# Patient Record
Sex: Female | Born: 1990 | Race: White | Hispanic: No | Marital: Married | Smoking: Never smoker
Health system: Southern US, Community
[De-identification: ages and names within clinical notes are randomized; demographics above are authoritative.]

## PROBLEM LIST (undated history)

## (undated) ENCOUNTER — Inpatient Hospital Stay (HOSPITAL_COMMUNITY): Payer: Self-pay

## (undated) DIAGNOSIS — Z789 Other specified health status: Secondary | ICD-10-CM

## (undated) DIAGNOSIS — Z8619 Personal history of other infectious and parasitic diseases: Secondary | ICD-10-CM

## (undated) HISTORY — PX: NO PAST SURGERIES: SHX2092

## (undated) HISTORY — DX: Personal history of other infectious and parasitic diseases: Z86.19

---

## 2011-08-26 ENCOUNTER — Ambulatory Visit (INDEPENDENT_AMBULATORY_CARE_PROVIDER_SITE_OTHER): Payer: 59 | Admitting: Internal Medicine

## 2011-08-26 VITALS — BP 133/80 | HR 68 | Temp 98.1°F | Resp 16 | Ht 65.75 in | Wt 107.0 lb

## 2011-08-26 DIAGNOSIS — R51 Headache: Secondary | ICD-10-CM

## 2011-08-26 DIAGNOSIS — M542 Cervicalgia: Secondary | ICD-10-CM

## 2011-08-26 MED ORDER — BUTALBITAL-APAP-CAFFEINE 50-325-40 MG PO TABS: 1.0000 | ORAL_TABLET | Freq: Two times a day (BID) | ORAL | Status: AC | PRN

## 2011-08-26 MED ORDER — CYCLOBENZAPRINE HCL 10 MG PO TABS: 10.0000 mg | ORAL_TABLET | Freq: Every day | ORAL | Status: AC

## 2011-08-26 NOTE — Progress Notes (Signed)
  Subjective:    Patient ID: Jodi Atkins, female    DOB: [birth year], 21 y.o.   MRN: [id number]  HPIHeadache for the past week Was initially throbbing and all over but is now mostly in the left occiput with tenderness in the left neck as well Denies vision changes, dizziness, nausea No history of illness No prior headache syndromes No illnesses or medications that are new in the last 3 months Neck pain occasionally wakes her at night as she moves    Review of SystemsNo photophobia or visual change/no glasses No chest pain or palpitations No sore throat or fatigue No change in gait No menstrual problems/Is not pregnant    Objective:   Physical Exam Vital signs within normal limits Pupils equal round reactive to light and accommodation/EOMs conjugate TMs and nares clear/oropharynx clear No nodes or thyromegaly Chest clear Heart regular without murmurs Cranial nerves II through XII intact Romberg negative No motor or sensory deficits Deep tendon reflexes symmetrical Neck has a good range of motion but there is tenderness in the left posterior cervical area extending into the left trapezius without spasm      Assessment & Plan:   1. Headache   2. Neck pain    Meds ordered this encounter  Medications  . cyclobenzaprine (FLEXERIL) 10 MG tablet    Sig: Take 1 tablet (10 mg total) by mouth at bedtime.    Dispense:  10 tablet    Refill:  0  . butalbital-acetaminophen-caffeine (FIORICET, ESGIC) 50-325-40 MG per tablet    Sig: Take 1 tablet by mouth 2 (two) times daily as needed for headache.    Dispense:  14 tablet    Refill:  0   Needs close followup If not better in 5-7 days will reexamine and consider whether imaging is necessary

## 2011-08-26 NOTE — Patient Instructions (Addendum)
Recheck in 7-10 days if headache is not completely resolved

## 2011-09-10 ENCOUNTER — Ambulatory Visit (INDEPENDENT_AMBULATORY_CARE_PROVIDER_SITE_OTHER): Payer: 59 | Admitting: Emergency Medicine

## 2011-09-10 VITALS — BP 130/81 | HR 93 | Temp 98.2°F | Resp 16 | Ht 65.5 in | Wt 107.0 lb

## 2011-09-10 DIAGNOSIS — Z3490 Encounter for supervision of normal pregnancy, unspecified, unspecified trimester: Secondary | ICD-10-CM

## 2011-09-10 DIAGNOSIS — Z3201 Encounter for pregnancy test, result positive: Secondary | ICD-10-CM

## 2011-09-10 NOTE — Patient Instructions (Signed)
Pregnancy  If you are planning on getting pregnant, it is a good idea to make a preconception appointment with your care- giver to discuss having a healthy lifestyle before getting pregnant. Such as, diet, weight, exercise, taking prenatal vitamins especially folic acid (it helps prevent brain and spinal cord defects), avoiding alcohol, smoking and illegal drugs, medical problems (diabetes, convulsions), family history of genetic problems, working conditions and immunizations. It is better to have knowledge of these things and do something about them before getting pregnant.  In your pregnancy, it is important to follow certain guidelines to have a healthy baby. It is very important to get good prenatal care and follow your caregiver's instructions. Prenatal care includes all the medical care you receive before your baby's birth. This helps to prevent problems during the pregnancy and childbirth.  HOME CARE INSTRUCTIONS    Start your prenatal visits by the 12th week of pregnancy or before when possible. They are usually scheduled monthly at first. They are more often in the last 2 months before delivery. It is important that you keep your caregiver's appointments and follow your caregiver's instructions regarding medication use, exercise, and diet.   During pregnancy, you are providing food for you and your baby. Eat a regular, well-balanced diet. Choose foods such as meat, fish, milk and other dairy products, vegetables, fruits, whole-grain breads and cereals. Your caregiver will inform you of the ideal weight gain depending on your current height and weight. Drink lots of liquids. Try to drink 8 glasses of water a day.   Alcohol is associated with a number of birth defects including fetal alcohol syndrome. It is best to avoid alcohol completely. Smoking will cause low birth rate and prematurity. Use of alcohol and nicotine during your pregnancy also increases the chances that your child will be chemically  dependent later in their life and may contribute to SIDS (Sudden Infant Death Syndrome).   Do not use illegal drugs.   Only take prescription or over-the-counter medications that are recommended by your caregiver. Other medications can cause genetic and physical problems in the baby.   Morning sickness can often be helped by keeping soda crackers at the bedside. Eat a couple before arising in the morning.   A sexual relationship may be continued until near the end of pregnancy if there are no other problems such as early (premature) leaking of amniotic fluid from the membranes, vaginal bleeding, painful intercourse or belly (abdominal) pain.   Exercise regularly. Check with your caregiver if you are unsure of the safety of some of your exercises.   Do not use hot tubs, steam rooms or saunas. These increase the risk of fainting or passing out and hurting yourself and the baby. Swimming is OK for exercise. Get plenty of rest, including afternoon naps when possible especially in the third trimester.   Avoid toxic odors and chemicals.   Do not wear high heels. They may cause you to lose your balance and fall.   Do not lift over 5 pounds. If you do lift anything, lift with your legs and thighs, not your back.   Avoid long trips, especially in the third trimester.   If you have to travel out of the city or state, take a copy of your medical records with you.  SEEK IMMEDIATE MEDICAL CARE IF:    You develop an unexplained oral temperature above 102 F (38.9 C), or as your caregiver suggests.   You have leaking of fluid from the vagina. If   leaking membranes are suspected, take your temperature and inform your caregiver of this when you call.   There is vaginal spotting or bleeding. Notify your caregiver of the amount and how many pads are used.   You continue to feel sick to your stomach (nauseous) and have no relief from remedies suggested, or you throw up (vomit) blood or coffee ground like  materials.   You develop upper abdominal pain.   You have round ligament discomfort in the lower abdominal area. This still must be evaluated by your caregiver.   You feel contractions of the uterus.   You do not feel the baby move, or there is less movement than before.   You have painful urination.   You have abnormal vaginal discharge.   You have persistent diarrhea.   You get a severe headache.   You have problems with your vision.   You develop muscle weakness.   You feel dizzy and faint.   You develop shortness of breath.   You develop chest pain.   You have back pain that travels down to your leg and feet.   You feel irregular or a very fast heartbeat.   You develop excessive weight gain in a short period of time (5 pounds in 3 to 5 days).   You are involved with a domestic violence situation.  Document Released: 02/27/2005 Document Revised: 02/16/2011 Document Reviewed: 08/21/2008  [employer] Patient Information 2012 [employer].

## 2011-09-10 NOTE — Progress Notes (Signed)
  Subjective:    Patient ID: Jodi Atkins, female    DOB: Jan 30, 1991, 21 y.o.   MRN: [id number]  HPI  patient here requesting a pregnancy test. Her last menstrual period was June 1. They're trying to conceive. She has never been pregnant before.    Review of Systems     Objective:   Physical Exam HEENT exam is unremarkable. Neck supple. Chest is clear to auscultation. Heart regular rate no murmur  Results for orders placed in visit on 09/10/11  POCT URINE PREGNANCY      Component Value Range   Preg Test, Ur Positive          Assessment & Plan:  Check pregnancy test and start prenatal vitamins one a day.

## 2011-11-08 LAB — OB RESULTS CONSOLE ABO/RH: RH Type: POSITIVE

## 2011-11-08 LAB — OB RESULTS CONSOLE HIV ANTIBODY (ROUTINE TESTING): HIV: NONREACTIVE

## 2011-11-08 LAB — OB RESULTS CONSOLE RUBELLA ANTIBODY, IGM: Rubella: IMMUNE

## 2011-11-08 LAB — OB RESULTS CONSOLE HEPATITIS B SURFACE ANTIGEN: Hepatitis B Surface Ag: NEGATIVE

## 2011-11-08 LAB — OB RESULTS CONSOLE GC/CHLAMYDIA: Gonorrhea: NEGATIVE

## 2012-01-31 ENCOUNTER — Ambulatory Visit (INDEPENDENT_AMBULATORY_CARE_PROVIDER_SITE_OTHER): Payer: 59 | Admitting: Family Medicine

## 2012-01-31 VITALS — BP 126/74 | HR 99 | Temp 98.3°F | Resp 17 | Ht 66.0 in | Wt 124.0 lb

## 2012-01-31 DIAGNOSIS — Z331 Pregnant state, incidental: Secondary | ICD-10-CM

## 2012-01-31 DIAGNOSIS — K219 Gastro-esophageal reflux disease without esophagitis: Secondary | ICD-10-CM

## 2012-01-31 DIAGNOSIS — J029 Acute pharyngitis, unspecified: Secondary | ICD-10-CM

## 2012-01-31 NOTE — Progress Notes (Signed)
Subjective:    Patient ID: Jodi Atkins, female    DOB: 03/03/1991, 21 y.o.   MRN: [id number]  HPI  Jodi Atkins is a 21 y.o. female  Sore throat - past  6 days.  Feels tight. Feels slight swollen, but breathing ok.  No rash. Sx's on and off - noted more last night.  No known sick contacts.  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  6 months pregnant.  [facility] - Dr. Joanette Gula.   No meds.     Review of Systems  Constitutional: Negative for fever and chills.  HENT: Positive for sore throat. Negative for congestion, rhinorrhea, sneezing, trouble swallowing, voice change and postnasal drip.   Eyes: Negative for discharge, redness and itching.  Respiratory: Negative for chest tightness, shortness of breath and stridor.   Skin: Negative for color change and rash.      Past Medical History  Diagnosis Date  . Hypertension   . History of chicken pox    History reviewed. No pertinent past surgical history. History   Social History  . Marital Status: Married    Spouse Name: N/A    Number of Children: N/A  . Years of Education: N/A   Occupational History  . Not on file.   Social History Main Topics  . Smoking status: Never Smoker   . Smokeless tobacco: Not on file  . Alcohol Use: No  . Drug Use: No  . Sexually Active: Yes    Birth Control/ Protection: None   Other Topics Concern  . Not on file   Social History Narrative  . No narrative on file       Objective:   Physical Exam  Vitals reviewed. Constitutional: She is oriented to person, place, and time. She appears well-developed and well-nourished. No distress.  HENT:  Head: Normocephalic and atraumatic.  Right Ear: Hearing, tympanic membrane, external ear and ear canal normal.  Left Ear: Hearing, tympanic membrane, external ear and ear canal normal.  Nose: Nose normal.  Mouth/Throat: Oropharynx is clear and moist. No oropharyngeal exudate.  Eyes: Conjunctivae normal and EOM are normal.  Pupils are equal, round, and reactive to light.  Neck: Normal range of motion. Neck supple. No thyromegaly present.  Cardiovascular: Normal rate, regular rhythm, normal heart sounds and intact distal pulses.   No murmur heard. Pulmonary/Chest: Effort normal and breath sounds normal. No respiratory distress. She has no decreased breath sounds. She has no wheezes. She has no rhonchi.  Abdominal:       gravid  Neurological: She is alert and oriented to person, place, and time.  Skin: Skin is warm and dry. No rash noted.  Psychiatric: She has a normal mood and affect. Her behavior is normal.   Results for orders placed in visit on 01/31/12  POCT RAPID STREP A (OFFICE)      Component Value Range   Rapid Strep A Screen Negative  Negative       Assessment & Plan:   Jodi Atkins is a 21 y.o. female 1. Throat soreness  POCT rapid strep A  2. Pregnant state, incidental    3. Reflux      Throat sx's - likely LPR/reflux at stage of pregnancy. No edema of oropharynx, doubt allergic sx's. Persistent, but nonworsening sx's for week.  Trial of zantac otc, benadryl - 25 qhs prn, rtc and ER precautions given.  If not improving in next few days - recheck, or follow up with OBGYN to discuss.  Patient Instructions  Your strep test was negative.  This is likely due to reflux, less likely allergies.  You can try over the counter tums or zantac each day, benadryl over the counter if needed - but this can make you sleepy.  If not improving in next few days - recheck or follow up with your obstetrician. Return to the clinic or go to the nearest emergency room if any of your symptoms worsen or new symptoms occur.    Heartburn During Pregnancy  Heartburn is a burning sensation in the chest caused by stomach acid backing up into the esophagus. Heartburn (also known as "reflux") is common in pregnancy because a certain hormone (progesterone) changes. The progesterone hormone may relax the valve that separates  the esophagus from the stomach. This allows acid to go up into the esophagus, causing heartburn. Heartburn may also happen in pregnancy because the enlarging uterus pushes up on the stomach, which pushes more acid into the esophagus. This is especially true in the later stages of pregnancy. Heartburn problems usually go away after giving birth. CAUSES   The progesterone hormone.  Changing hormone levels.  The growing uterus that pushes stomach acid upward.  Large meals.  Certain foods and drinks.  Exercise.  Increased acid production. SYMPTOMS   Burning pain in the chest or lower throat.  Bitter taste in the mouth.  Coughing. DIAGNOSIS  Heartburn is typically diagnosed by your caregiver when taking a careful history of your concern. Your caregiver may order a blood test to check for a certain type of bacteria that is associated with heartburn. Sometimes, heartburn is diagnosed by prescribing a heartburn medicine to see if the symptoms improve. It is rare in pregnancy to have a procedure called an endoscopy. This is when a tube with a light and a camera on the end is used to examine the esophagus and the stomach. TREATMENT   Your caregiver may tell you to use certain over-the-counter medicines (antacids, acid reducers) for mild heartburn.  Your caregiver may prescribe medicines to decrease stomach acid or to protect your stomach lining.  Your caregiver may recommend certain diet changes.  For severe cases, your caregiver may recommend that the head of the bed be elevated on blocks. (Sleeping with more pillows is not an effective treatment as it only changes the position of your head and does not improve the main problem of stomach acid refluxing into the esophagus.) HOME CARE INSTRUCTIONS   Take all medicines as directed by your caregiver.  Raise the head of your bed by putting blocks under the legs if instructed to by your caregiver.  Do not exercise right after  eating.  Avoid eating 2 or 3 hours before bed. Do not lie down right after eating.  Eat small meals throughout the day instead of 3 large meals.  Identify foods and beverages that make your symptoms worse and avoid them. Foods you may want to avoid include:  Peppers.  Chocolate.  High-fat foods, including fried foods.  Spicy foods.  Garlic and onions.  Citrus fruits, including oranges, grapefruit, lemons, and limes.  Food containing tomatoes or tomato products.  Mint.  Carbonated and caffeinated drinks.  Vinegar. SEEK IMMEDIATE MEDICAL CARE IF:   You have severe chest pain that goes down your arm or into your jaw or neck.  You feel sweaty, dizzy, or lightheaded.  You become short of breath.  You vomit blood.  You have difficulty or pain with swallowing.  You have bloody or  black, tarry stools.  You have episodes of heartburn more than 3 times a week, for more than 2 weeks. MAKE SURE YOU:  Understand these instructions.  Will watch your condition.  Will get help right away if you are not doing well or get worse. Document Released: 02/25/2000 Document Revised: 05/22/2011 Document Reviewed: 08/18/2010 [facility] Patient Information 2013 [place], [place].

## 2012-01-31 NOTE — Patient Instructions (Signed)
Your strep test was negative.  This is likely due to reflux, less likely allergies.  You can try over the counter tums or zantac each day, benadryl over the counter if needed - but this can make you sleepy.  If not improving in next few days - recheck or follow up with your obstetrician. Return to the clinic or go to the nearest emergency room if any of your symptoms worsen or new symptoms occur.    Heartburn During Pregnancy  Heartburn is a burning sensation in the chest caused by stomach acid backing up into the esophagus. Heartburn (also known as "reflux") is common in pregnancy because a certain hormone (progesterone) changes. The progesterone hormone may relax the valve that separates the esophagus from the stomach. This allows acid to go up into the esophagus, causing heartburn. Heartburn may also happen in pregnancy because the enlarging uterus pushes up on the stomach, which pushes more acid into the esophagus. This is especially true in the later stages of pregnancy. Heartburn problems usually go away after giving birth. CAUSES   The progesterone hormone.  Changing hormone levels.  The growing uterus that pushes stomach acid upward.  Large meals.  Certain foods and drinks.  Exercise.  Increased acid production. SYMPTOMS   Burning pain in the chest or lower throat.  Bitter taste in the mouth.  Coughing. DIAGNOSIS  Heartburn is typically diagnosed by your caregiver when taking a careful history of your concern. Your caregiver may order a blood test to check for a certain type of bacteria that is associated with heartburn. Sometimes, heartburn is diagnosed by prescribing a heartburn medicine to see if the symptoms improve. It is rare in pregnancy to have a procedure called an endoscopy. This is when a tube with a light and a camera on the end is used to examine the esophagus and the stomach. TREATMENT   Your caregiver may tell you to use certain over-the-counter medicines  (antacids, acid reducers) for mild heartburn.  Your caregiver may prescribe medicines to decrease stomach acid or to protect your stomach lining.  Your caregiver may recommend certain diet changes.  For severe cases, your caregiver may recommend that the head of the bed be elevated on blocks. (Sleeping with more pillows is not an effective treatment as it only changes the position of your head and does not improve the main problem of stomach acid refluxing into the esophagus.) HOME CARE INSTRUCTIONS   Take all medicines as directed by your caregiver.  Raise the head of your bed by putting blocks under the legs if instructed to by your caregiver.  Do not exercise right after eating.  Avoid eating 2 or 3 hours before bed. Do not lie down right after eating.  Eat small meals throughout the day instead of 3 large meals.  Identify foods and beverages that make your symptoms worse and avoid them. Foods you may want to avoid include:  Peppers.  Chocolate.  High-fat foods, including fried foods.  Spicy foods.  Garlic and onions.  Citrus fruits, including oranges, grapefruit, lemons, and limes.  Food containing tomatoes or tomato products.  Mint.  Carbonated and caffeinated drinks.  Vinegar. SEEK IMMEDIATE MEDICAL CARE IF:   You have severe chest pain that goes down your arm or into your jaw or neck.  You feel sweaty, dizzy, or lightheaded.  You become short of breath.  You vomit blood.  You have difficulty or pain with swallowing.  You have bloody or black, tarry stools.  You have episodes of heartburn more than 3 times a week, for more than 2 weeks. MAKE SURE YOU:  Understand these instructions.  Will watch your condition.  Will get help right away if you are not doing well or get worse. Document Released: 02/25/2000 Document Revised: 05/22/2011 Document Reviewed: 08/18/2010 [facility] Patient Information 2013 [place], [place].

## 2012-03-13 NOTE — L&D Delivery Note (Signed)
Patient was C/C/+2 and pushed for 90 minutes with epidural.   NSVD  female infant, Apgars 9,9, weight P.   The patient had one midline second degree episiotomy and one L labial lacerations; the first was repaired with 2-0 vicryl R; The second was bleeding vigorously and did not stop bleeding until entire laceration was closed with Atkins running lock of 3-0 vicryl R. After closure, there was no expansion of hematoma and the area remained hemostatic . Fundus was firm. EBL was slightly more than expected at 600cc. Placenta was delivered intact. Vagina was clear.  Baby was vigorous to bedside.  Jodi Atkins

## 2012-05-24 ENCOUNTER — Inpatient Hospital Stay (HOSPITAL_COMMUNITY): Admission: AD | Admit: 2012-05-24 | Payer: Self-pay | Source: Ambulatory Visit | Admitting: Obstetrics and Gynecology

## 2012-05-27 ENCOUNTER — Telehealth (HOSPITAL_COMMUNITY): Payer: Self-pay | Admitting: *Deleted

## 2012-05-27 ENCOUNTER — Encounter (HOSPITAL_COMMUNITY): Payer: Self-pay | Admitting: *Deleted

## 2012-05-27 NOTE — Telephone Encounter (Signed)
Preadmission screen  

## 2012-05-31 ENCOUNTER — Encounter (HOSPITAL_COMMUNITY): Payer: Self-pay

## 2012-05-31 ENCOUNTER — Encounter (HOSPITAL_COMMUNITY): Payer: Self-pay | Admitting: Anesthesiology

## 2012-05-31 ENCOUNTER — Inpatient Hospital Stay (HOSPITAL_COMMUNITY)
Admission: RE | Admit: 2012-05-31 | Discharge: 2012-06-02 | DRG: 775 | Disposition: A | Discharge status: Home / Self Care | Payer: Managed Care, Other (non HMO) | Source: Ambulatory Visit | Attending: Obstetrics and Gynecology | Admitting: Obstetrics and Gynecology

## 2012-05-31 ENCOUNTER — Inpatient Hospital Stay (HOSPITAL_COMMUNITY): Payer: Managed Care, Other (non HMO) | Admitting: Anesthesiology

## 2012-05-31 DIAGNOSIS — O480 Post-term pregnancy: Principal | ICD-10-CM | POA: Diagnosis present

## 2012-05-31 DIAGNOSIS — O2650 Maternal hypotension syndrome, unspecified trimester: Secondary | ICD-10-CM | POA: Diagnosis not present

## 2012-05-31 LAB — CBC
HCT: 30.7 % — ABNORMAL LOW (ref 36.0–46.0)
MCV: 81.6 fL (ref 78.0–100.0)
RBC: 3.76 MIL/uL — ABNORMAL LOW (ref 3.87–5.11)
RDW: 14.4 % (ref 11.5–15.5)
WBC: 7.6 10*3/uL (ref 4.0–10.5)

## 2012-05-31 MED ORDER — ACETAMINOPHEN 325 MG PO TABS: 650.0000 mg | ORAL_TABLET | ORAL | Status: DC | PRN

## 2012-05-31 MED ORDER — EPHEDRINE 5 MG/ML INJ
10.0000 mg | INTRAVENOUS | Status: DC | PRN
  Filled 2012-05-31: qty 4

## 2012-05-31 MED ORDER — PHENYLEPHRINE 40 MCG/ML (10ML) SYRINGE FOR IV PUSH (FOR BLOOD PRESSURE SUPPORT)
80.0000 ug | PREFILLED_SYRINGE | INTRAVENOUS | Status: DC | PRN
  Filled 2012-05-31: qty 5

## 2012-05-31 MED ORDER — ZOLPIDEM TARTRATE 5 MG PO TABS: 5.0000 mg | ORAL_TABLET | Freq: Every evening | ORAL | Status: DC | PRN

## 2012-05-31 MED ORDER — DIPHENHYDRAMINE HCL 50 MG/ML IJ SOLN: 12.5000 mg | INTRAMUSCULAR | Status: DC | PRN

## 2012-05-31 MED ORDER — PHENYLEPHRINE 40 MCG/ML (10ML) SYRINGE FOR IV PUSH (FOR BLOOD PRESSURE SUPPORT): 80.0000 ug | PREFILLED_SYRINGE | INTRAVENOUS | Status: DC | PRN

## 2012-05-31 MED ORDER — LACTATED RINGERS IV SOLN: 500.0000 mL | Freq: Once | INTRAVENOUS | Status: DC

## 2012-05-31 MED ORDER — FENTANYL 2.5 MCG/ML BUPIVACAINE 1/10 % EPIDURAL INFUSION (WH - ANES)
14.0000 mL/h | INTRAMUSCULAR | Status: DC | PRN
  Administered 2012-05-31 (×2): 14 mL/h via EPIDURAL
  Filled 2012-05-31 (×2): qty 125

## 2012-05-31 MED ORDER — EPHEDRINE 5 MG/ML INJ: 10.0000 mg | INTRAVENOUS | Status: DC | PRN

## 2012-05-31 MED ORDER — OXYTOCIN BOLUS FROM INFUSION
500.0000 mL | INTRAVENOUS | Status: DC
  Administered 2012-05-31: 500 mL via INTRAVENOUS

## 2012-05-31 MED ORDER — LACTATED RINGERS IV SOLN
INTRAVENOUS | Status: DC
  Administered 2012-05-31 (×6): via INTRAVENOUS

## 2012-05-31 MED ORDER — NALBUPHINE SYRINGE 5 MG/0.5 ML
5.0000 mg | INJECTION | INTRAMUSCULAR | Status: DC | PRN
Start: 2012-05-31 — End: 2012-06-01

## 2012-05-31 MED ORDER — TERBUTALINE SULFATE 1 MG/ML IJ SOLN: 0.2500 mg | Freq: Once | INTRAMUSCULAR | Status: AC | PRN

## 2012-05-31 MED ORDER — OXYCODONE-ACETAMINOPHEN 5-325 MG PO TABS: 1.0000 | ORAL_TABLET | ORAL | Status: DC | PRN

## 2012-05-31 MED ORDER — IBUPROFEN 600 MG PO TABS: 600.0000 mg | ORAL_TABLET | Freq: Four times a day (QID) | ORAL | Status: DC | PRN

## 2012-05-31 MED ORDER — FLEET ENEMA 7-19 GM/118ML RE ENEM: 1.0000 | ENEMA | RECTAL | Status: DC | PRN

## 2012-05-31 MED ORDER — OXYTOCIN 40 UNITS IN LACTATED RINGERS INFUSION - SIMPLE MED
1.0000 m[IU]/min | INTRAVENOUS | Status: DC
  Administered 2012-05-31: 2 m[IU]/min via INTRAVENOUS
  Filled 2012-05-31: qty 1000

## 2012-05-31 MED ORDER — LACTATED RINGERS IV SOLN
500.0000 mL | INTRAVENOUS | Status: DC | PRN
  Administered 2012-05-31: 1000 mL via INTRAVENOUS

## 2012-05-31 MED ORDER — OXYTOCIN 40 UNITS IN LACTATED RINGERS INFUSION - SIMPLE MED: 62.5000 mL/h | INTRAVENOUS | Status: DC

## 2012-05-31 MED ORDER — ONDANSETRON HCL 4 MG/2ML IJ SOLN: 4.0000 mg | Freq: Four times a day (QID) | INTRAMUSCULAR | Status: DC | PRN

## 2012-05-31 MED ORDER — LIDOCAINE HCL (PF) 1 % IJ SOLN
30.0000 mL | INTRAMUSCULAR | Status: DC | PRN
  Filled 2012-05-31: qty 30

## 2012-05-31 MED ORDER — CITRIC ACID-SODIUM CITRATE 334-500 MG/5ML PO SOLN: 30.0000 mL | ORAL | Status: DC | PRN

## 2012-05-31 MED ORDER — SODIUM BICARBONATE 8.4 % IV SOLN
INTRAVENOUS | Status: DC | PRN
  Administered 2012-05-31: 5 mL via EPIDURAL

## 2012-05-31 NOTE — Anesthesia Procedure Notes (Signed)

## 2012-05-31 NOTE — H&P (Signed)
22 y.o. [redacted]w[redacted]d  G1P0 comes in for postdates induction.  Otherwise has good fetal movement and no bleeding.  Past Medical History  Diagnosis Date  . Hypertension   . History of chicken pox    History reviewed. No pertinent past surgical history.  OB History   Grav Para Term Preterm Abortions TAB SAB Ect Mult Living   1              # Outc Date GA Lbr Len/2nd Wgt Sex Del Anes PTL Lv   1 CUR               History   Social History  . Marital Status: Married    Spouse Name: N/A    Number of Children: N/A  . Years of Education: N/A   Occupational History  . Not on file.   Social History Main Topics  . Smoking status: Never Smoker   . Smokeless tobacco: Not on file  . Alcohol Use: No  . Drug Use: No  . Sexually Active: Yes    Birth Control/ Protection: None   Other Topics Concern  . Not on file   Social History Narrative  . No narrative on file   Aleve and Ibuprofen    Prenatal Transfer Tool  Maternal Diabetes: No Genetic Screening: Declined Maternal Ultrasounds/Referrals: Normal Fetal Ultrasounds or other Referrals:  None Maternal Substance Abuse:  No Significant Maternal Medications:  None Significant Maternal Lab Results: None  Other JYN:WGNF    Filed Vitals:   05/31/12 0729  BP: 139/89  Pulse: 87  Temp: 98.1 F (36.7 C)  Resp: 20     Lungs/Cor:  NAD Abdomen:  soft, gravid Ex:  no cords, erythema SVE:  3/70/-2, clear fluid with AROM FHTs:  110, good STV, NST R Toco:  q4-10   A/P   Term postdates induction.  GBS Neg.  Janeice Stegall A

## 2012-05-31 NOTE — Anesthesia Preprocedure Evaluation (Signed)

## 2012-06-01 LAB — CBC
HCT: 21.4 % — ABNORMAL LOW (ref 36.0–46.0)
Hemoglobin: 7.1 g/dL — ABNORMAL LOW (ref 12.0–15.0)
MCH: 26.7 pg (ref 26.0–34.0)
MCV: 80.5 fL (ref 78.0–100.0)
RBC: 2.66 MIL/uL — ABNORMAL LOW (ref 3.87–5.11)
WBC: 15.2 10*3/uL — ABNORMAL HIGH (ref 4.0–10.5)

## 2012-06-01 LAB — TYPE AND SCREEN

## 2012-06-01 MED ORDER — BENZOCAINE-MENTHOL 20-0.5 % EX AERO
1.0000 "application " | INHALATION_SPRAY | CUTANEOUS | Status: DC | PRN
  Filled 2012-06-01: qty 56

## 2012-06-01 MED ORDER — MAGNESIUM HYDROXIDE 400 MG/5ML PO SUSP: 30.0000 mL | ORAL | Status: DC | PRN

## 2012-06-01 MED ORDER — ZOLPIDEM TARTRATE 5 MG PO TABS: 5.0000 mg | ORAL_TABLET | Freq: Every evening | ORAL | Status: DC | PRN

## 2012-06-01 MED ORDER — SODIUM CHLORIDE 0.9 % IV SOLN: 250.0000 mL | INTRAVENOUS | Status: DC | PRN

## 2012-06-01 MED ORDER — METHYLERGONOVINE MALEATE 0.2 MG PO TABS: 0.2000 mg | ORAL_TABLET | ORAL | Status: DC | PRN

## 2012-06-01 MED ORDER — ONDANSETRON HCL 4 MG PO TABS: 4.0000 mg | ORAL_TABLET | ORAL | Status: DC | PRN

## 2012-06-01 MED ORDER — SODIUM CHLORIDE 0.9 % IJ SOLN
3.0000 mL | Freq: Two times a day (BID) | INTRAMUSCULAR | Status: DC
  Administered 2012-06-01: 3 mL via INTRAVENOUS

## 2012-06-01 MED ORDER — LANOLIN HYDROUS EX OINT: TOPICAL_OINTMENT | CUTANEOUS | Status: DC | PRN

## 2012-06-01 MED ORDER — SIMETHICONE 80 MG PO CHEW
80.0000 mg | CHEWABLE_TABLET | ORAL | Status: DC | PRN
  Administered 2012-06-01: 80 mg via ORAL

## 2012-06-01 MED ORDER — TETANUS-DIPHTH-ACELL PERTUSSIS 5-2.5-18.5 LF-MCG/0.5 IM SUSP: 0.5000 mL | Freq: Once | INTRAMUSCULAR | Status: DC

## 2012-06-01 MED ORDER — ONDANSETRON HCL 4 MG/2ML IJ SOLN: 4.0000 mg | INTRAMUSCULAR | Status: DC | PRN

## 2012-06-01 MED ORDER — OXYCODONE-ACETAMINOPHEN 5-325 MG PO TABS
1.0000 | ORAL_TABLET | ORAL | Status: DC | PRN
  Administered 2012-06-01 (×2): 1 via ORAL
  Filled 2012-06-01 (×2): qty 1

## 2012-06-01 MED ORDER — PRENATAL MULTIVITAMIN CH
1.0000 | ORAL_TABLET | Freq: Every day | ORAL | Status: DC
  Administered 2012-06-01 – 2012-06-02 (×2): 1 via ORAL
  Filled 2012-06-01 (×2): qty 1

## 2012-06-01 MED ORDER — DIPHENHYDRAMINE HCL 25 MG PO CAPS: 25.0000 mg | ORAL_CAPSULE | Freq: Four times a day (QID) | ORAL | Status: DC | PRN

## 2012-06-01 MED ORDER — MEASLES, MUMPS & RUBELLA VAC ~~LOC~~ INJ
0.5000 mL | INJECTION | Freq: Once | SUBCUTANEOUS | Status: DC
  Filled 2012-06-01: qty 0.5

## 2012-06-01 MED ORDER — SENNOSIDES-DOCUSATE SODIUM 8.6-50 MG PO TABS
2.0000 | ORAL_TABLET | Freq: Every day | ORAL | Status: DC
  Administered 2012-06-01: 2 via ORAL

## 2012-06-01 MED ORDER — SODIUM CHLORIDE 0.9 % IJ SOLN: 3.0000 mL | INTRAMUSCULAR | Status: DC | PRN

## 2012-06-01 MED ORDER — METHYLERGONOVINE MALEATE 0.2 MG/ML IJ SOLN: 0.2000 mg | INTRAMUSCULAR | Status: DC | PRN

## 2012-06-01 MED ORDER — FERROUS SULFATE 325 (65 FE) MG PO TABS
325.0000 mg | ORAL_TABLET | Freq: Two times a day (BID) | ORAL | Status: DC
  Administered 2012-06-01 – 2012-06-02 (×3): 325 mg via ORAL
  Filled 2012-06-01 (×3): qty 1

## 2012-06-01 MED ORDER — WITCH HAZEL-GLYCERIN EX PADS: 1.0000 "application " | MEDICATED_PAD | CUTANEOUS | Status: DC | PRN

## 2012-06-01 MED ORDER — ACETAMINOPHEN 325 MG PO TABS: 650.0000 mg | ORAL_TABLET | Freq: Four times a day (QID) | ORAL | Status: DC | PRN

## 2012-06-01 MED ORDER — DIBUCAINE 1 % RE OINT: 1.0000 "application " | TOPICAL_OINTMENT | RECTAL | Status: DC | PRN

## 2012-06-01 NOTE — Discharge Summary (Signed)
Obstetric Discharge Summary Reason for Admission: induction of labor Prenatal Procedures: none Intrapartum Procedures: spontaneous vaginal delivery Postpartum Procedures: none Complications-Operative and Postpartum: 2 degree perineal laceration and labial laceration; pp anemia  CBC    Component Value Date/Time   WBC 15.2* 06/01/2012 0535   RBC 2.66* 06/01/2012 0535   HGB 7.1* 06/01/2012 0535   HCT 21.4* 06/01/2012 0535   PLT 182 06/01/2012 0535   MCV 80.5 06/01/2012 0535   MCH 26.7 06/01/2012 0535   MCHC 33.2 06/01/2012 0535   RDW 14.4 06/01/2012 0535      Discharge Diagnoses: Term Pregnancy-delivered  Discharge Information: Date: 06/01/2012 Activity: pelvic rest Diet: routine Medications: Ibuprofen and Iron Condition: stable Instructions: refer to practice specific booklet Discharge to: home Follow-up Information   Follow up with Jodi Stgermain A, MD. Schedule an appointment as soon as possible for Atkins visit in 4 weeks.   Contact information:   [address] Jodi Atkins [place] [address] [phone]       Newborn Data: Live born female  Birth Weight: 7 lb 12 oz (3515 g) APGAR: 9, 9  Home with mother.  Jodi Atkins 06/01/2012, 4:36 AM

## 2012-06-01 NOTE — Progress Notes (Signed)
Patient is eating, ambulating, voiding.  Pain control is good. Pt had one episode of dizziness but was safely transferred back bed.     Filed Vitals:   06/01/12 0000 06/01/12 0010 06/01/12 0033 06/01/12 0100  BP: 114/57 112/60 114/61 116/74  Pulse: 78 75 86 90  Temp:    99.2 F (37.3 C)  TempSrc:    Oral  Resp: 16 14 14 18   Height:      Weight:      SpO2:    98%    Fundus firm Perineum without swelling.  Lab Results  Component Value Date   WBC 7.6 05/31/2012   HGB 10.0* 05/31/2012   HCT 30.7* 05/31/2012   MCV 81.6 05/31/2012   PLT 233 05/31/2012    --/--/O POS (03/21 0755)/RI  A/P Post partum day 1; dizziness after blood loss about 600.  Routine care with anemia precautions and iron.  CBC P.  Expect d/c tomorrow.    Monserrat Vidaurri A

## 2012-06-01 NOTE — Anesthesia Postprocedure Evaluation (Signed)
Anesthesia Post Note  Patient: @Jodi Atkins @[facility]   Procedure(s) Performed: CLE/C/S  Anesthesia type: Epidural  Patient location: Mother/Baby  Post pain: Pain level controlled  Post assessment: Post-op Vital signs reviewed  Last Vitals: BP 130/66  Pulse 84  Temp(Src) 37 C (Oral)  Resp 18  Ht 5\' 5"  (1.651 m)  Wt 146 lb (66.225 kg)  BMI 24.3 kg/m2  SpO2 98%  LMP 08/18/2011  Post vital signs: Reviewed  Level of consciousness: awake  Complications: No apparent anesthesia complications

## 2012-06-01 NOTE — Progress Notes (Signed)
Pt up to the bathroom using stedy. Pt seated on the toilet with stedy in place and had a syncopal episode. Pt responded to ammonia, IV fluid bolus, and cold rag placed on the back of her neck. Pt moved back to bed to be assessed.

## 2012-06-02 NOTE — Progress Notes (Signed)
Patient is eating, ambulating, voiding.  Pain control is good.  Vital signs stable and no dizziness.  Filed Vitals:   06/01/12 0600 06/01/12 1223 06/01/12 1809 06/02/12 0510  BP: 130/66 106/65 96/60 100/54  Pulse: 84 88 98 86  Temp: 98.6 F (37 C) 98.1 F (36.7 C) 98.7 F (37.1 C) 97.8 F (36.6 C)  TempSrc: Oral Axillary Oral Oral  Resp: 18 16 18 20   Height:      Weight:      SpO2: 98%       Fundus firm Perineum without swelling.  Lab Results  Component Value Date   WBC 15.2* 06/01/2012   HGB 7.1* 06/01/2012   HCT 21.4* 06/01/2012   MCV 80.5 06/01/2012   PLT 182 06/01/2012    --/--/O POS (03/22 0535)/RI  A/P Post partum day 2.  Routine care.  Expect d/c today.    Deno Sida A

## 2012-07-11 ENCOUNTER — Ambulatory Visit: Payer: Managed Care, Other (non HMO)

## 2012-07-11 ENCOUNTER — Ambulatory Visit (INDEPENDENT_AMBULATORY_CARE_PROVIDER_SITE_OTHER): Payer: Managed Care, Other (non HMO) | Admitting: Emergency Medicine

## 2012-07-11 VITALS — BP 112/68 | HR 68 | Temp 98.3°F | Resp 16 | Ht 66.0 in | Wt 116.4 lb

## 2012-07-11 DIAGNOSIS — R079 Chest pain, unspecified: Secondary | ICD-10-CM

## 2012-07-11 MED ORDER — TRAMADOL HCL 50 MG PO TABS: 50.0000 mg | ORAL_TABLET | Freq: Four times a day (QID) | ORAL | Status: DC | PRN

## 2012-07-11 NOTE — Progress Notes (Signed)
[facility] [address] [place] [address] [phone]  Date:  07/11/2012   Name:  Jodi Atkins   DOB:  02-May-1990   MRN:  [id number]  PCP:  Tally Due, MD    Chief Complaint: Chest Pain   History of Present Illness:  Jodi Atkins is a 22 y.o. very pleasant female patient who presents with the following:  Has a two week history of left anterior chest wall pain. Located in the left anterior infra mammary chest wall.  No antecedent illness or injury.  No overuse.  No cough or coryza, shortness of breath or wheezing.  No nausea or vomiting.  No stool change or GERD symptoms.  6 weeks post partum uncomplicated pregnancy.  No improvement with over the counter medications or other home remedies. Denies other complaint or health concern today.   There are no active problems to display for this patient.   Past Medical History  Diagnosis Date  . Hypertension   . History of chicken pox     No past surgical history on file.  History  Substance Use Topics  . Smoking status: Never Smoker   . Smokeless tobacco: Not on file  . Alcohol Use: No    Family History  Problem Relation Age of Onset  . Cancer Mother     lymphoma  . Cancer Maternal Aunt   . Cancer Maternal Grandmother     lymphoma  . Cancer Paternal Grandmother     Allergies  Allergen Reactions  . Aleve (Naproxen) Anaphylaxis  . Ibuprofen Hives    Medication list has been reviewed and updated.  Current Outpatient Prescriptions on File Prior to Visit  Medication Sig Dispense Refill  . Prenatal Vit-Fe Fumarate-FA (PRENATAL MULTIVITAMIN) TABS Take 1 tablet by mouth daily at 12 noon.       No current facility-administered medications on file prior to visit.    Review of Systems:  As per HPI, otherwise negative.    Physical Examination: Filed Vitals:   07/11/12 1751  BP: 112/68  Pulse: 68  Temp: 98.3 F (36.8 C)  Resp: 16   Filed Vitals:   07/11/12 1751  Height: 5\' 6"   (1.676 m)  Weight: 116 lb 6.4 oz (52.799 kg)   Body mass index is 18.8 kg/(m^2). Ideal Body Weight: Weight in (lb) to have BMI = 25: 154.6  GEN: WDWN, NAD, Non-toxic, A & O x 3 HEENT: Atraumatic, Normocephalic. Neck supple. No masses, No LAD. Ears and Nose: No external deformity. CV: RRR, No M/G/R. No JVD. No thrill. No extra heart sounds. PULM: CTA B, no wheezes, crackles, rhonchi. No retractions. No resp. distress. No accessory muscle use. Left anterior chest wall tenderness   ABD: S, NT, ND, +BS. No rebound. No HSM. EXTR: No c/c/e NEURO Normal gait.  PSYCH: Normally interactive. Conversant. Not depressed or anxious appearing.  Calm demeanor.    Assessment and Plan: Chest wall pain Signed,  Phillips Odor, MD  [facility] reading (PRIMARY) by  Dr. Dareen Piano.   Negative chest.

## 2012-07-11 NOTE — Patient Instructions (Addendum)
Chest Wall Pain Chest wall pain is pain in or around the bones and muscles of your chest. It may take up to 6 weeks to get better. It may take longer if you must stay physically active in your work and activities.  CAUSES  Chest wall pain may happen on its own. However, it may be caused by:  A viral illness like the flu.  Injury.  Coughing.  Exercise.  Arthritis.  Fibromyalgia.  Shingles. HOME CARE INSTRUCTIONS   Avoid overtiring physical activity. Try not to strain or perform activities that cause pain. This includes any activities using your chest or your abdominal and side muscles, especially if heavy weights are used.  Put ice on the sore area.  Put ice in a plastic bag.  Place a towel between your skin and the bag.  Leave the ice on for 15 to 20 minutes per hour while awake for the first 2 days.  Only take over-the-counter or prescription medicines for pain, discomfort, or fever as directed by your caregiver. SEEK IMMEDIATE MEDICAL CARE IF:   Your pain increases, or you are very uncomfortable.  You have a fever.  Your chest pain becomes worse.  You have new, unexplained symptoms.  You have nausea or vomiting.  You feel sweaty or lightheaded.  You have a cough with phlegm (sputum), or you cough up blood. MAKE SURE YOU:   Understand these instructions.  Will watch your condition.  Will get help right away if you are not doing well or get worse. Document Released: 02/27/2005 Document Revised: 05/22/2011 Document Reviewed: 10/24/2010 Exit[employer] Patient Information 2013 [employer].  

## 2012-07-17 ENCOUNTER — Telehealth: Payer: Self-pay

## 2012-07-17 NOTE — Telephone Encounter (Signed)
What are her symptoms? She states she is not any better at all, she has not developed a rash at area. Has rib pain, please advise, what is next for her ? rtc

## 2012-07-17 NOTE — Telephone Encounter (Signed)
PT STILL NOT FEELING WELL. [phone]

## 2012-07-18 NOTE — Telephone Encounter (Signed)
RTC for further eval

## 2012-07-19 NOTE — Telephone Encounter (Signed)
Thanks, I have called her to advise.  

## 2012-07-24 ENCOUNTER — Ambulatory Visit (INDEPENDENT_AMBULATORY_CARE_PROVIDER_SITE_OTHER): Payer: Managed Care, Other (non HMO) | Admitting: Emergency Medicine

## 2012-07-24 VITALS — BP 99/61 | HR 71 | Temp 98.5°F | Resp 17 | Ht 66.0 in | Wt 115.0 lb

## 2012-07-24 DIAGNOSIS — R1012 Left upper quadrant pain: Secondary | ICD-10-CM

## 2012-07-24 LAB — POCT CBC
Lymph, poc: 2.3 (ref 0.6–3.4)
MCH, POC: 22.6 pg — AB (ref 27–31.2)
MCHC: 29.3 g/dL — AB (ref 31.8–35.4)
MCV: 77.3 fL — AB (ref 80–97)
MID (cbc): 0.4 (ref 0–0.9)
MPV: 8.3 fL (ref 0–99.8)
POC LYMPH PERCENT: 49.2 %L (ref 10–50)
POC MID %: 9.1 %M (ref 0–12)
Platelet Count, POC: 344 10*3/uL (ref 142–424)
WBC: 4.7 10*3/uL (ref 4.6–10.2)

## 2012-07-24 LAB — POCT UA - MICROSCOPIC ONLY
Bacteria, U Microscopic: NEGATIVE
Casts, Ur, LPF, POC: NEGATIVE
Crystals, Ur, HPF, POC: NEGATIVE
Yeast, UA: NEGATIVE

## 2012-07-24 LAB — POCT URINALYSIS DIPSTICK
Glucose, UA: NEGATIVE
Nitrite, UA: NEGATIVE
Spec Grav, UA: 1.02
Urobilinogen, UA: 0.2

## 2012-07-24 MED ORDER — POLYETHYLENE GLYCOL 3350 17 GM/SCOOP PO POWD: 17.0000 g | Freq: Every day | ORAL | Status: DC

## 2012-07-24 NOTE — Progress Notes (Signed)
[facility] [address] [place] [address] [phone]  Date:  07/24/2012   Name:  Jodi Atkins   DOB:  07-27-1990   MRN:  [id number]  PCP:  Tally Due, MD    Chief Complaint: Flank Pain   History of Present Illness:  Jodi Atkins is a 22 y.o. very pleasant female patient who presents with the following:  7 weeks post partum.  Has six week pain in left upper abdomen and left side of chest "inside her chest" below the breast.  Says the pain radiates around the left side to her back and is a burning pain.  Pain is not particularly severe and is intermittent.  Worse when lays on left side  No factors lessen pain She has no fever, chills, cough, nausea or vomiting.  No stool change, cough, coryza, bruising, shortness of breath.  No decreased exercise tolerance.  No weight loss, food intolerance, dysuria, urgency or frequency.  No blood in stool or vomiting blood, no blood in urine.  No heartburn.  No improvement with over the counter medications or other home remedies. Denies other complaint or health concern today.  Seen 2 weeks ago with same pain and had a normal CXR.  There are no active problems to display for this patient.   Past Medical History  Diagnosis Date  . Hypertension   . History of chicken pox     No past surgical history on file.  History  Substance Use Topics  . Smoking status: Never Smoker   . Smokeless tobacco: Not on file  . Alcohol Use: No    Family History  Problem Relation Age of Onset  . Cancer Mother     lymphoma  . Cancer Maternal Aunt   . Cancer Maternal Grandmother     lymphoma  . Cancer Paternal Grandmother     Allergies  Allergen Reactions  . Aleve (Naproxen) Anaphylaxis  . Ibuprofen Hives    Medication list has been reviewed and updated.  Current Outpatient Prescriptions on File Prior to Visit  Medication Sig Dispense Refill  . Prenatal Vit-Fe Fumarate-FA (PRENATAL MULTIVITAMIN) TABS Take 1 tablet by  mouth daily at 12 noon.      . traMADol (ULTRAM) 50 MG tablet Take 1 tablet (50 mg total) by mouth every 6 (six) hours as needed for pain.  40 tablet  1   No current facility-administered medications on file prior to visit.    Review of Systems:  As per HPI, otherwise negative.    Physical Examination: Filed Vitals:   07/24/12 1533  BP: 99/61  Pulse: 71  Temp: 98.5 F (36.9 C)  Resp: 17   Filed Vitals:   07/24/12 1533  Height: 5\' 6"  (1.676 m)  Weight: 115 lb (52.164 kg)   Body mass index is 18.57 kg/(m^2). Ideal Body Weight: Weight in (lb) to have BMI = 25: 154.6  GEN: WDWN, NAD, Non-toxic, A & O x 3 HEENT: Atraumatic, Normocephalic. Neck supple. No masses, No LAD. Ears and Nose: No external deformity. CV: RRR, No M/G/R. No JVD. No thrill. No extra heart sounds. PULM: CTA B, no wheezes, crackles, rhonchi. No retractions. No resp. distress. No accessory muscle use. ABD: S, NT, ND, +BS. No rebound. No HSM. EXTR: No c/c/e NEURO Normal gait.  PSYCH: Normally interactive. Conversant. Not depressed or anxious appearing.  Calm demeanor.    Assessment and Plan: Left upper quadrant pain miralax    Signed,  Phillips Odor, MD  Results for orders placed in visit on 07/24/12  POCT CBC      Result Value Range   WBC 4.7  4.6 - 10.2 K/uL   Lymph, poc 2.3  0.6 - 3.4   POC LYMPH PERCENT 49.2  10 - 50 %L   MID (cbc) 0.4  0 - 0.9   POC MID % 9.1  0 - 12 %M   POC Granulocyte 2.0  2 - 6.9   Granulocyte percent 41.7  37 - 80 %G   RBC 4.68  4.04 - 5.48 M/uL   Hemoglobin 10.6 (*) 12.2 - 16.2 g/dL   HCT, POC 16.1 (*) 09.6 - 47.9 %   MCV 77.3 (*) 80 - 97 fL   MCH, POC 22.6 (*) 27 - 31.2 pg   MCHC 29.3 (*) 31.8 - 35.4 g/dL   RDW, POC 04.5     Platelet Count, POC 344  142 - 424 K/uL   MPV 8.3  0 - 99.8 fL  POCT UA - MICROSCOPIC ONLY      Result Value Range   WBC, Ur, HPF, POC 3-7     RBC, urine, microscopic tntc     Bacteria, U Microscopic neg     Mucus, UA small      Epithelial cells, urine per micros 1-3     Crystals, Ur, HPF, POC neg     Casts, Ur, LPF, POC neg     Yeast, UA neg    POCT URINALYSIS DIPSTICK      Result Value Range   Color, UA yellow     Clarity, UA clear     Glucose, UA neg     Bilirubin, UA neg     Ketones, UA neg     Spec Grav, UA 1.020     Blood, UA moderate     pH, UA 7.5     Protein, UA trace     Urobilinogen, UA 0.2     Nitrite, UA neg     Leukocytes, UA Trace

## 2013-08-02 ENCOUNTER — Emergency Department (HOSPITAL_COMMUNITY)
Admission: EM | Admit: 2013-08-02 | Discharge: 2013-08-02 | Disposition: A | Discharge status: Home / Self Care | Payer: Managed Care, Other (non HMO) | Attending: Emergency Medicine | Admitting: Emergency Medicine

## 2013-08-02 ENCOUNTER — Emergency Department (HOSPITAL_COMMUNITY): Payer: Managed Care, Other (non HMO)

## 2013-08-02 ENCOUNTER — Encounter (HOSPITAL_COMMUNITY): Payer: Self-pay | Admitting: Emergency Medicine

## 2013-08-02 DIAGNOSIS — I10 Essential (primary) hypertension: Secondary | ICD-10-CM | POA: Insufficient documentation

## 2013-08-02 DIAGNOSIS — R42 Dizziness and giddiness: Secondary | ICD-10-CM | POA: Insufficient documentation

## 2013-08-02 DIAGNOSIS — R079 Chest pain, unspecified: Secondary | ICD-10-CM | POA: Insufficient documentation

## 2013-08-02 DIAGNOSIS — Z862 Personal history of diseases of the blood and blood-forming organs and certain disorders involving the immune mechanism: Secondary | ICD-10-CM | POA: Insufficient documentation

## 2013-08-02 DIAGNOSIS — Z8619 Personal history of other infectious and parasitic diseases: Secondary | ICD-10-CM | POA: Insufficient documentation

## 2013-08-02 LAB — I-STAT TROPONIN, ED: Troponin i, poc: 0 ng/mL (ref 0.00–0.08)

## 2013-08-02 LAB — CBC
HEMATOCRIT: 30.6 % — AB (ref 36.0–46.0)
HEMOGLOBIN: 9.5 g/dL — AB (ref 12.0–15.0)
MCH: 21.6 pg — ABNORMAL LOW (ref 26.0–34.0)
MCHC: 31 g/dL (ref 30.0–36.0)
MCV: 69.7 fL — ABNORMAL LOW (ref 78.0–100.0)
Platelets: 232 10*3/uL (ref 150–400)
RBC: 4.39 MIL/uL (ref 3.87–5.11)
RDW: 16.7 % — ABNORMAL HIGH (ref 11.5–15.5)
WBC: 5.2 10*3/uL (ref 4.0–10.5)

## 2013-08-02 LAB — BASIC METABOLIC PANEL
BUN: 12 mg/dL (ref 6–23)
CHLORIDE: 105 meq/L (ref 96–112)
CO2: 22 mEq/L (ref 19–32)
Calcium: 9 mg/dL (ref 8.4–10.5)
Creatinine, Ser: 0.63 mg/dL (ref 0.50–1.10)
GFR calc Af Amer: >90 mL/min (ref >90)
GLUCOSE: 103 mg/dL — AB (ref 70–99)
POTASSIUM: 3.5 meq/L — AB (ref 3.7–5.3)
Sodium: 138 mEq/L (ref 137–147)

## 2013-08-02 MED ORDER — TRAMADOL HCL 50 MG PO TABS: 50.0000 mg | ORAL_TABLET | Freq: Four times a day (QID) | ORAL | Status: DC | PRN

## 2013-08-02 NOTE — ED Provider Notes (Signed)
CSN: [id number]     Arrival date & time 08/02/13  0007 History   First MD Initiated Contact with Patient 08/02/13 0256     Chief Complaint  Patient presents with  . Chest Pain     (Consider location/radiation/quality/duration/timing/severity/associated sxs/prior Treatment) HPI Comments: 23 year old female, no history of any medical problems, presents with a complaint of left-sided chest pain. States that this was upper chest pain, sharp and stabbing, I woke her from sleep this evening and radiated down the left arm. At the time she had a mild amount of dizziness. She reports that when she woke up she had some shortness of breath and felt as if she was not breathing but was able to breathe without difficulty upon awakening. She has never had symptoms like this in the past, she denies any other risk factors for pulmonary embolism and states that she has had no recent injury swelling trauma immobilization and has not had any oral contraceptive use, smoking or family history of blood clots or cardiac disease. She has not been feeling feverish, no coughing, no phlegm production and no other complaints. At this time her symptoms have gradually improved and are now near completely resolved.  Patient is a 23 y.o. female presenting with chest pain. The history is provided by the patient and the spouse.  Chest Pain   Past Medical History  Diagnosis Date  . Hypertension   . History of chicken pox    History reviewed. No pertinent past surgical history. Family History  Problem Relation Age of Onset  . Cancer Mother     lymphoma  . Cancer Maternal Aunt   . Cancer Maternal Grandmother     lymphoma  . Cancer Paternal Grandmother    History  Substance Use Topics  . Smoking status: Never Smoker   . Smokeless tobacco: Not on file  . Alcohol Use: No   OB History   Grav Para Term Preterm Abortions TAB SAB Ect Mult Living   1 1 1       1      Review of Systems  Cardiovascular: Positive for chest  pain.  All other systems reviewed and are negative.     Allergies  Aleve and Ibuprofen  Home Medications   Prior to Admission medications   Medication Sig Start Date End Date Taking? Authorizing Provider  traMADol (ULTRAM) 50 MG tablet Take 1 tablet (50 mg total) by mouth every 6 (six) hours as needed. 08/02/13   Vida Roller, MD   BP 111/64  Pulse 70  Temp(Src) 98.6 F (37 C) (Oral)  Resp 14  Wt 106 lb 2 oz (48.138 kg)  SpO2 100%  LMP 07/03/2013 Physical Exam  Nursing note and vitals reviewed. Constitutional: She appears well-developed and well-nourished. No distress.  HENT:  Head: Normocephalic and atraumatic.  Mouth/Throat: Oropharynx is clear and moist. No oropharyngeal exudate.  Eyes: Conjunctivae and EOM are normal. Pupils are equal, round, and reactive to light. Right eye exhibits no discharge. Left eye exhibits no discharge. No scleral icterus.  Neck: Normal range of motion. Neck supple. No JVD present. No thyromegaly present.  Cardiovascular: Normal rate, regular rhythm, normal heart sounds and intact distal pulses.  Exam reveals no gallop and no friction rub.   No murmur heard. Pulmonary/Chest: Effort normal and breath sounds normal. No respiratory distress. She has no wheezes. She has no rales.  Abdominal: Soft. Bowel sounds are normal. She exhibits no distension and no mass. There is no tenderness.  Musculoskeletal: Normal  range of motion. She exhibits no edema and no tenderness.  Lymphadenopathy:    She has no cervical adenopathy.  Neurological: She is alert. Coordination normal.  Skin: Skin is warm and dry. No rash noted. No erythema.  Psychiatric: She has a normal mood and affect. Her behavior is normal.    ED Course  Procedures (including critical care time) Labs Review Labs Reviewed  CBC - Abnormal; Notable for the following:    Hemoglobin 9.5 (*)    HCT 30.6 (*)    MCV 69.7 (*)    MCH 21.6 (*)    RDW 16.7 (*)    All other components within  normal limits  BASIC METABOLIC PANEL - Abnormal; Notable for the following:    Potassium 3.5 (*)    Glucose, Bld 103 (*)    All other components within normal limits  Rosezena SensorI-STAT TROPOININ, ED    Imaging Review Dg Chest 2 View  08/02/2013   CLINICAL DATA:  Chest pain.  EXAM: CHEST  2 VIEW  COMPARISON:  07/11/2012  FINDINGS: The heart size and mediastinal contours are within normal limits. Both lungs are clear. The visualized skeletal structures are unremarkable.  There has been appreciable  weight loss since the prior exam.  IMPRESSION: No acute abnormality.  Weight loss since the prior exam.   Electronically Signed   By: Geanie CooleyJim  Maxwell M.D.   On: 08/02/2013 01:21     EKG Interpretation   Date/Time:  Saturday Aug 02 2013 00:13:49 EDT Ventricular Rate:  72 PR Interval:  116 QRS Duration: 82 QT Interval:  382 QTC Calculation: 418 R Axis:   97 Text Interpretation:  Normal sinus rhythm with sinus arrhythmia Rightward  axis Borderline ECG No old tracing to compare Confirmed by Yurika Pereda  MD,  Osama Coleson ([id number]) on 08/02/2013 3:09:50 AM      MDM   Final diagnoses:  Chest pain    Normal chest x-ray, normal EKG, lab work is unremarkable except for mild anemia which the patient has chronically. Symptomatically the patient is a was completely resolved, this does not appear to be cardiac in nature it is not exertional sharp and stabbing quality. She will be discharged home, unfortunately she cannot take anti-inflammatories and we'll have to use tramadol for severe pain and will followup with her family doctor.   Meds given in ED:  Medications - No data to display  New Prescriptions   TRAMADOL (ULTRAM) 50 MG TABLET    Take 1 tablet (50 mg total) by mouth every 6 (six) hours as needed.        Vida RollerBrian D Rossi Silvestro, MD 08/02/13 [phone]

## 2013-08-02 NOTE — ED Notes (Signed)
Presents with chest pain that woke her from sleep thi evening in left side of chest pain radiates down left arm associated with dizziness. Pain described as sharp. Denies nausea

## 2013-08-02 NOTE — ED Notes (Signed)
Pt reports waking up last night with left sided CP that radiated down left arm. Denies nausea, vomiting SOB with CP. Reports dizziness with CP. Pt denies CP and dizziness at this time.

## 2014-01-12 ENCOUNTER — Encounter (HOSPITAL_COMMUNITY): Payer: Self-pay | Admitting: Emergency Medicine

## 2015-02-06 IMAGING — CR DG CHEST 2V
2 series · 2 of 2 positions shown · non-contrast
Comparison: None.

CLINICAL DATA: Chest pain

CHEST - 2 VIEW

[PA]
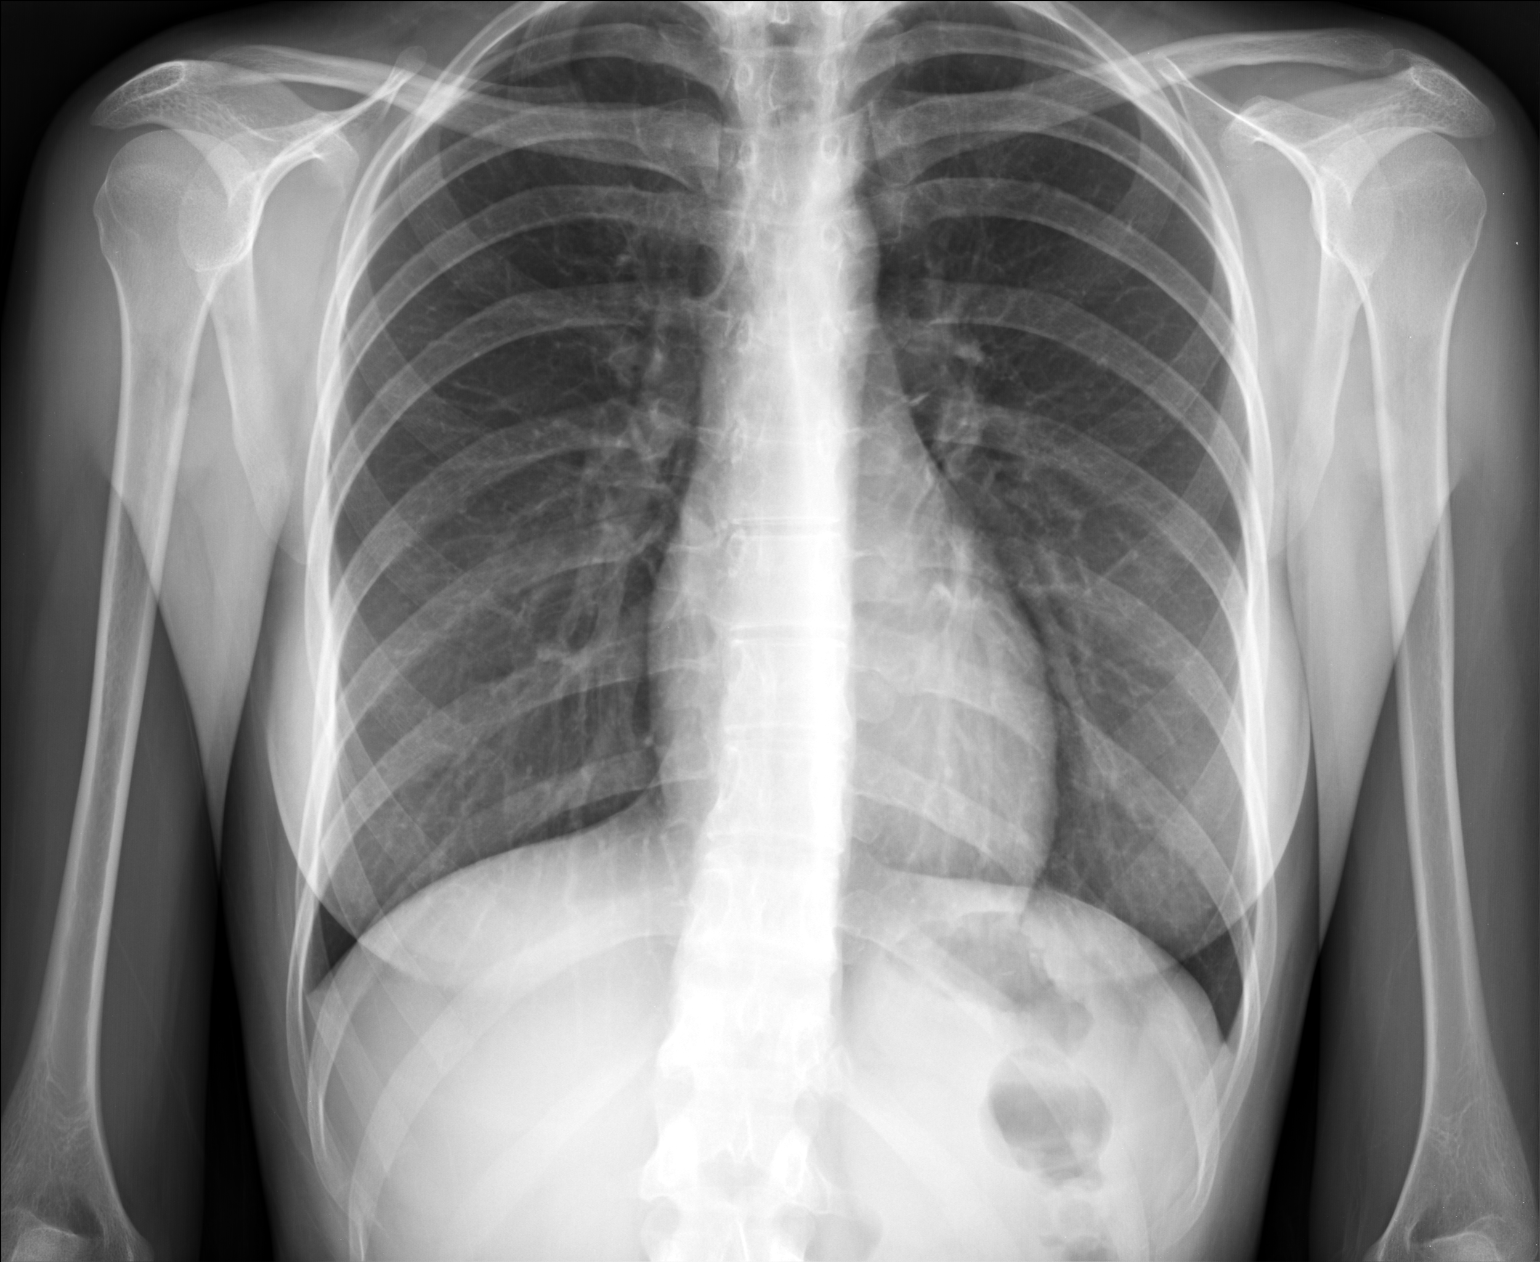

[lateral]
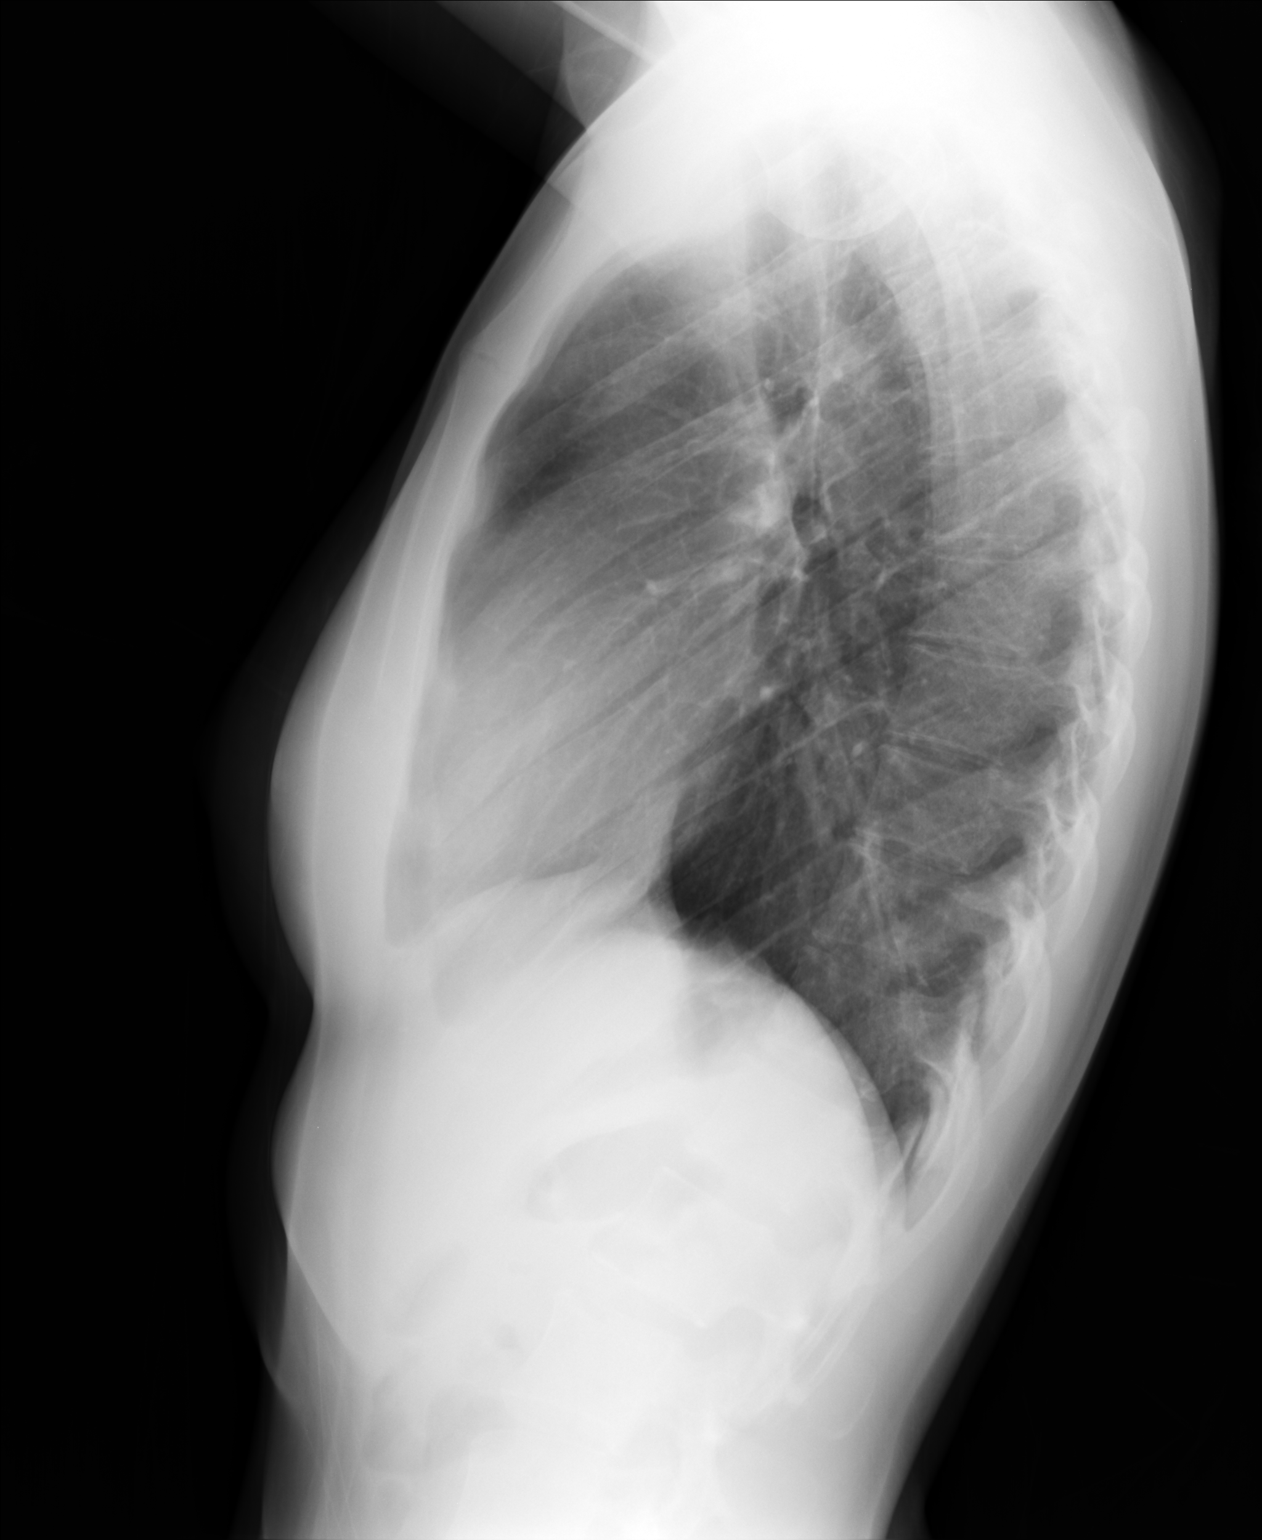

[2 of 2 positions shown; findings below may reference images not displayed]

FINDINGS: The heart, mediastinum and hila are within normal limits.
The lungs are clear.  The bony thorax and surrounding soft tissues
are unremarkable.
IMPRESSION: Normal chest radiographs

## 2015-03-11 ENCOUNTER — Other Ambulatory Visit: Payer: Self-pay | Admitting: Family Medicine

## 2015-03-11 DIAGNOSIS — N6320 Unspecified lump in the left breast, unspecified quadrant: Secondary | ICD-10-CM

## 2015-03-14 NOTE — L&D Delivery Note (Signed)
Delivery Note Patient pushed well for 10 minutes.  At 3:22 PM a viable female was delivered via Vaginal, Spontaneous Delivery (Presentation: Occiput Anterior ).  APGAR: 8, 9; weight 7 lb 10.9 oz (3485 g).   Placenta status: spontaneous, in tact w 3V Cord:  with the following complications: None.  Cord pH: n/a  Anesthesia:  Epidural Episiotomy: None Lacerations: 2nd degree Suture Repair: 3.0 vicryl rapide Est. Blood Loss (mL): 400  Mom to postpartum.  Baby to Couplet care / Skin to Skin.  [facility]unctionDYANNA Atkins Jodi Atkins 12/09/2015, 4:37 PM

## 2015-03-16 ENCOUNTER — Ambulatory Visit
Admission: RE | Admit: 2015-03-16 | Discharge: 2015-03-16 | Disposition: A | Discharge status: Home / Self Care | Payer: BLUE CROSS/BLUE SHIELD | Source: Ambulatory Visit | Attending: Family Medicine | Admitting: Family Medicine

## 2015-03-16 DIAGNOSIS — N6320 Unspecified lump in the left breast, unspecified quadrant: Secondary | ICD-10-CM

## 2015-04-28 ENCOUNTER — Other Ambulatory Visit: Payer: Self-pay | Admitting: Obstetrics and Gynecology

## 2015-04-29 LAB — CYTOLOGY - PAP

## 2015-05-15 ENCOUNTER — Inpatient Hospital Stay (HOSPITAL_COMMUNITY)
Admission: AD | Admit: 2015-05-15 | Discharge: 2015-05-15 | Disposition: A | Discharge status: Home / Self Care | Payer: BLUE CROSS/BLUE SHIELD | Source: Ambulatory Visit | Attending: Obstetrics | Admitting: Obstetrics

## 2015-05-15 ENCOUNTER — Inpatient Hospital Stay (HOSPITAL_COMMUNITY): Payer: BLUE CROSS/BLUE SHIELD

## 2015-05-15 ENCOUNTER — Encounter (HOSPITAL_COMMUNITY): Payer: Self-pay

## 2015-05-15 DIAGNOSIS — O468X1 Other antepartum hemorrhage, first trimester: Secondary | ICD-10-CM

## 2015-05-15 DIAGNOSIS — O418X10 Other specified disorders of amniotic fluid and membranes, first trimester, not applicable or unspecified: Secondary | ICD-10-CM

## 2015-05-15 DIAGNOSIS — O161 Unspecified maternal hypertension, first trimester: Secondary | ICD-10-CM | POA: Insufficient documentation

## 2015-05-15 DIAGNOSIS — Z3A11 11 weeks gestation of pregnancy: Secondary | ICD-10-CM | POA: Insufficient documentation

## 2015-05-15 DIAGNOSIS — O209 Hemorrhage in early pregnancy, unspecified: Secondary | ICD-10-CM | POA: Diagnosis present

## 2015-05-15 LAB — WET PREP, GENITAL
Clue Cells Wet Prep HPF POC: NONE SEEN
SPERM: NONE SEEN
TRICH WET PREP: NONE SEEN
YEAST WET PREP: NONE SEEN

## 2015-05-15 LAB — URINALYSIS, ROUTINE W REFLEX MICROSCOPIC
Bilirubin Urine: NEGATIVE
Glucose, UA: NEGATIVE mg/dL
Ketones, ur: 15 mg/dL — AB
Leukocytes, UA: NEGATIVE
Nitrite: NEGATIVE
Protein, ur: NEGATIVE mg/dL
SPECIFIC GRAVITY, URINE: 1.015 (ref 1.005–1.030)
pH: 6 (ref 5.0–8.0)

## 2015-05-15 LAB — URINE MICROSCOPIC-ADD ON
Bacteria, UA: NONE SEEN
WBC, UA: NONE SEEN WBC/hpf (ref 0–5)

## 2015-05-15 LAB — POCT PREGNANCY, URINE: Preg Test, Ur: POSITIVE — AB

## 2015-05-15 NOTE — Discharge Instructions (Signed)

## 2015-05-15 NOTE — MAU Note (Signed)
Onset of bleeding earlier today was light now heavy has changed pad one time, denies cramping.  LMP 02/26/15

## 2015-05-15 NOTE — MAU Provider Note (Signed)
MAU HISTORY AND PHYSICAL  Chief Complaint:  Vaginal Bleeding   Jodi Atkins is a 25 y.o.  [id number] with IUP at [redacted]w[redacted]d presenting for Vaginal Bleeding  Has had two ultrasounds, last was at 8 weeks, showing IUP. No previous bleeding this pregnancy. Bleeding enough to fill a pad in last 2 hours. Bleeding started noon today. No pain or cramping. No fever, otherwise feeling well. Rh positive. No dysuria. No leakage of fluid.   Past Medical History  Diagnosis Date  . Hypertension   . History of chicken pox     Past Surgical History  Procedure Laterality Date  . No past surgeries      Family History  Problem Relation Age of Onset  . Cancer Mother     lymphoma  . Cancer Maternal Aunt   . Cancer Maternal Grandmother     lymphoma  . Cancer Paternal Grandmother     Social History  Substance Use Topics  . Smoking status: Never Smoker   . Smokeless tobacco: None  . Alcohol Use: No    Allergies  Allergen Reactions  . Aleve [Naproxen] Anaphylaxis  . Ibuprofen Hives    Prescriptions prior to admission  Medication Sig Dispense Refill Last Dose  . Progesterone 25 MG SUPP Place 1 suppository vaginally 2 (two) times daily.   05/14/2015 at Unknown time  . traMADol (ULTRAM) 50 MG tablet Take 1 tablet (50 mg total) by mouth every 6 (six) hours as needed. (Patient not taking: Reported on 05/15/2015) 15 tablet 0 Not Taking at Unknown time    Review of Systems - Negative except for what is mentioned in HPI.  Physical Exam  Blood pressure 114/88, pulse 83, temperature 98.4 F (36.9 C), temperature source Oral, resp. rate 18, height  (1.676 m), weight 102 lb (46.267 kg), last menstrual period 02/26/2015. GENERAL: Well-developed, well-nourished female in no acute distress.  LUNGS: Clear to auscultation bilaterally.  HEART: Regular rate and rhythm. ABDOMEN: Soft, nontender, nondistended, gravid.  EXTREMITIES: Nontender, no edema, 2+ distal pulses. SSE: mild/moderate amount red blood  including extending from visually closed cervical os   Labs: Results for orders placed or performed during the hospital encounter of 05/15/15 (from the past 24 hour(s))  Urinalysis, Routine w reflex microscopic (not at [facility])   Collection Time: 05/15/15  3:41 PM  Result Value Ref Range   Color, Urine YELLOW YELLOW   APPearance CLEAR CLEAR   Specific Gravity, Urine 1.015 1.005 - 1.030   pH 6.0 5.0 - 8.0   Glucose, UA NEGATIVE NEGATIVE mg/dL   Hgb urine dipstick LARGE (A) NEGATIVE   Bilirubin Urine NEGATIVE NEGATIVE   Ketones, ur 15 (A) NEGATIVE mg/dL   Protein, ur NEGATIVE NEGATIVE mg/dL   Nitrite NEGATIVE NEGATIVE   Leukocytes, UA NEGATIVE NEGATIVE  Urine microscopic-add on   Collection Time: 05/15/15  3:41 PM  Result Value Ref Range   Squamous Epithelial / LPF 0-5 (A) NONE SEEN   WBC, UA NONE SEEN 0 - 5 WBC/hpf   RBC / HPF 6-30 0 - 5 RBC/hpf   Bacteria, UA NONE SEEN NONE SEEN  Wet prep, genital   Collection Time: 05/15/15  4:15 PM  Result Value Ref Range   Yeast Wet Prep HPF POC NONE SEEN NONE SEEN   Trich, Wet Prep NONE SEEN NONE SEEN   Clue Cells Wet Prep HPF POC NONE SEEN NONE SEEN   WBC, Wet Prep HPF POC FEW (A) NONE SEEN   Sperm NONE SEEN   Pregnancy, urine  POC   Collection Time: 05/15/15  4:26 PM  Result Value Ref Range   Preg Test, Ur POSITIVE (A) NEGATIVE    Imaging Studies:  [place] Ob Comp Less 14 Wks  05/15/2015  CLINICAL DATA:  Vaginal bleeding during first trimester pregnancy. Assigned gestational age of [redacted] weeks 1 day by prior office ultrasound. EXAM: OBSTETRIC <14 WK ULTRASOUND TECHNIQUE: Transabdominal ultrasound was performed for evaluation of the gestation as well as the maternal uterus and adnexal regions. COMPARISON:  None. FINDINGS: Intrauterine gestational sac: Visualized/normal in shape. Yolk sac:  Not visualized Embryo:  Visualized Cardiac Activity: Visualized Heart Rate: 182 bpm CRL:   4.2  mm   11 w 1 d                US EDC: 12/03/2015 Subchorionic  hemorrhage: A moderate to large subchorionic hemorrhage is seen measuring approximately 8.2 x 4.8 x 6.2 cm. Maternal uterus/adnexae: Right ovary is normal appearance. Left ovary not directly visualized however no adnexal mass identified. Trace amount of simple free fluid noted. IMPRESSION: Single living IUP with assigned gestational age of [redacted] weeks 1 day based on prior outside ultrasound. Appropriate fetal growth. Large subchorionic hemorrhage noted. Electronically Signed   By: Myles RosenthalJohn  Stahl M.D.   On: 05/15/2015 16:49    Assessment: Jodi Atkins is  25 y.o. G4P1021 at 4428w1d presents with vaginal bleeding 2/2 large subchorionic hemorrhage. Rh positive, hemodynamically stable, bleeding is itself not severe. Called and discussed w/ OB on call Dr. Chestine Sporelark who is in agreement with the following:  Plan: - counseled regarding increased risk for spontaneous miscarriage - bleeding and infection return precautions - ob f/u next week  Jodi Bilisoah B Rakan Atkins 3/4/20175:34 PM

## 2015-05-17 LAB — GC/CHLAMYDIA PROBE AMP (~~LOC~~) NOT AT ARMC
Chlamydia: NEGATIVE
NEISSERIA GONORRHEA: NEGATIVE

## 2015-05-26 LAB — OB RESULTS CONSOLE RPR: RPR: NONREACTIVE

## 2015-05-26 LAB — OB RESULTS CONSOLE GC/CHLAMYDIA
Chlamydia: NEGATIVE
Gonorrhea: NEGATIVE

## 2015-05-26 LAB — OB RESULTS CONSOLE HEPATITIS B SURFACE ANTIGEN: HEP B S AG: NEGATIVE

## 2015-05-26 LAB — OB RESULTS CONSOLE ABO/RH: RH TYPE: POSITIVE

## 2015-05-26 LAB — OB RESULTS CONSOLE RUBELLA ANTIBODY, IGM: RUBELLA: IMMUNE

## 2015-05-26 LAB — OB RESULTS CONSOLE ANTIBODY SCREEN: Antibody Screen: NEGATIVE

## 2015-05-26 LAB — OB RESULTS CONSOLE HIV ANTIBODY (ROUTINE TESTING): HIV: NONREACTIVE

## 2015-06-17 ENCOUNTER — Other Ambulatory Visit: Payer: Self-pay | Admitting: Obstetrics and Gynecology

## 2015-11-03 ENCOUNTER — Other Ambulatory Visit: Payer: Self-pay | Admitting: Obstetrics and Gynecology

## 2015-11-03 LAB — OB RESULTS CONSOLE GBS: GBS: NEGATIVE

## 2015-11-04 ENCOUNTER — Encounter (HOSPITAL_COMMUNITY): Payer: Self-pay | Admitting: *Deleted

## 2015-11-04 ENCOUNTER — Inpatient Hospital Stay (HOSPITAL_COMMUNITY)
Admission: AD | Admit: 2015-11-04 | Discharge: 2015-11-05 | Disposition: A | Discharge status: Home / Self Care | Payer: BLUE CROSS/BLUE SHIELD | Source: Ambulatory Visit | Attending: Obstetrics and Gynecology | Admitting: Obstetrics and Gynecology

## 2015-11-04 DIAGNOSIS — O4703 False labor before 37 completed weeks of gestation, third trimester: Secondary | ICD-10-CM | POA: Insufficient documentation

## 2015-11-04 DIAGNOSIS — O164 Unspecified maternal hypertension, complicating childbirth: Secondary | ICD-10-CM | POA: Insufficient documentation

## 2015-11-04 DIAGNOSIS — Z79899 Other long term (current) drug therapy: Secondary | ICD-10-CM | POA: Insufficient documentation

## 2015-11-04 DIAGNOSIS — Z3A36 36 weeks gestation of pregnancy: Secondary | ICD-10-CM | POA: Insufficient documentation

## 2015-11-04 MED ORDER — LACTATED RINGERS IV BOLUS (SEPSIS)
1000.0000 mL | Freq: Once | INTRAVENOUS | Status: AC
  Administered 2015-11-05: 1000 mL via INTRAVENOUS

## 2015-11-04 MED ORDER — BETAMETHASONE SOD PHOS & ACET 6 (3-3) MG/ML IJ SUSP
12.0000 mg | Freq: Once | INTRAMUSCULAR | Status: AC
  Administered 2015-11-05: 12 mg via INTRAMUSCULAR
  Filled 2015-11-04: qty 2

## 2015-11-04 NOTE — MAU Note (Signed)
Pt reports she started having contractions about 5 hours, getting stronger and faster

## 2015-11-05 ENCOUNTER — Inpatient Hospital Stay (HOSPITAL_COMMUNITY)
Admission: AD | Admit: 2015-11-05 | Discharge: 2015-11-05 | Disposition: A | Discharge status: Home / Self Care | Payer: BLUE CROSS/BLUE SHIELD | Source: Ambulatory Visit | Attending: Obstetrics and Gynecology | Admitting: Obstetrics and Gynecology

## 2015-11-05 DIAGNOSIS — Z3493 Encounter for supervision of normal pregnancy, unspecified, third trimester: Secondary | ICD-10-CM

## 2015-11-05 DIAGNOSIS — Z3A36 36 weeks gestation of pregnancy: Secondary | ICD-10-CM | POA: Insufficient documentation

## 2015-11-05 DIAGNOSIS — O4703 False labor before 37 completed weeks of gestation, third trimester: Secondary | ICD-10-CM | POA: Diagnosis not present

## 2015-11-05 DIAGNOSIS — O164 Unspecified maternal hypertension, complicating childbirth: Secondary | ICD-10-CM | POA: Diagnosis not present

## 2015-11-05 DIAGNOSIS — Z79899 Other long term (current) drug therapy: Secondary | ICD-10-CM | POA: Diagnosis not present

## 2015-11-05 LAB — URINALYSIS, ROUTINE W REFLEX MICROSCOPIC
BILIRUBIN URINE: NEGATIVE
GLUCOSE, UA: NEGATIVE mg/dL
Hgb urine dipstick: NEGATIVE
Ketones, ur: >80 mg/dL — AB
LEUKOCYTES UA: NEGATIVE
NITRITE: NEGATIVE
PH: 6 (ref 5.0–8.0)
Protein, ur: NEGATIVE mg/dL
SPECIFIC GRAVITY, URINE: 1.025 (ref 1.005–1.030)

## 2015-11-05 MED ORDER — BETAMETHASONE SOD PHOS & ACET 6 (3-3) MG/ML IJ SUSP
12.0000 mg | Freq: Once | INTRAMUSCULAR | Status: AC
  Administered 2015-11-05: 12 mg via INTRAMUSCULAR
  Filled 2015-11-05: qty 2

## 2015-11-05 NOTE — MAU Provider Note (Signed)
History     CSN: [id number]  Arrival date and time: 11/04/15 2259   First Provider Initiated Contact with Patient 11/05/15 0002      Chief Complaint  Patient presents with  . Contractions   Jodi Atkins is a 25 y.o. [id number] at 4250w0d who presents today with contractions she states that they began around 1800, and have continued. She rates her pain 4/10 at this time. She denies any VB or LOF. She confirms normal fetal movement. She denies any aggravating factors. She has tried increasing fluids and rest, but that has not helped. She denies any complications with her first pregnancy, and had a term vaginal delivery. She denies any complications thus far with this pregnancy.       Past Medical History:  Diagnosis Date  . History of chicken pox   . Hypertension     Past Surgical History:  Procedure Laterality Date  . NO PAST SURGERIES      Family History  Problem Relation Age of Onset  . Cancer Mother     lymphoma  . Cancer Maternal Grandmother     lymphoma  . Cancer Paternal Grandmother   . Cancer Maternal Aunt     Social History  Substance Use Topics  . Smoking status: Never Smoker  . Smokeless tobacco: Never Used  . Alcohol use No    Allergies:  Allergies  Allergen Reactions  . Aleve [Naproxen] Anaphylaxis  . Ibuprofen Hives    Prescriptions Prior to Admission  Medication Sig Dispense Refill Last Dose  . Prenatal Vit-Fe Fumarate-FA (MULTIVITAMIN-PRENATAL) 27-0.8 MG TABS tablet Take 1 tablet by mouth daily at 12 noon.   11/04/2015 at Unknown time  . Progesterone 25 MG SUPP Place 1 suppository vaginally 2 (two) times daily.   More than a month at Unknown time  . traMADol (ULTRAM) 50 MG tablet Take 1 tablet (50 mg total) by mouth every 6 (six) hours as needed. (Patient not taking: Reported on 05/15/2015) 15 tablet 0 Not Taking at Unknown time    Review of Systems  Constitutional: Negative for chills and fever.  Gastrointestinal: Positive for abdominal pain.  Negative for constipation, diarrhea (no diarrhea, but my stomach has been upset. ), nausea and vomiting.  Genitourinary: Negative for dysuria, frequency and urgency.   Physical Exam   Blood pressure 120/64, pulse 74, temperature 98.2 F (36.8 C), temperature source Oral, resp. rate 17, height 5\' 6"  (1.676 m), weight 134 lb (60.8 kg), last menstrual period 02/26/2015, SpO2 100 %.  Physical Exam  Nursing note and vitals reviewed. Constitutional: She is oriented to person, place, and time. She appears well-developed and well-nourished. No distress.  HENT:  Head: Normocephalic.  Cardiovascular: Normal rate.   Respiratory: Effort normal.  GI: Soft. There is no tenderness. There is no rebound.  Neurological: She is alert and oriented to person, place, and time.  Skin: Skin is warm and dry.  Psychiatric: She has a normal mood and affect.    FHT: 135, moderate with 15x15 accels, no decels Toco: q 2-3 min contractions. Spaced to q 4-5 mins after IV fluids.  MAU Course  Procedures  MDM 2355: RN spoke with Dr. Claiborne Billingsallahan. Will give IV fluids and BMZ.  RN spoke with Dr. Claiborne Billingsallahan, will DC home return tomorrow for repeat BMZ.   Assessment and Plan   1. Threatened premature labor, third trimester    DC home Comfort measures reviewed  3rd Trimester precautions  PTL precautions  Fetal kick counts RX: none, return tomorrow  for repeat BMZ.  Return to MAU as needed FU with OB as planned  Follow-up Information    [facility] [place] [facility]ERN[facility] ADMISSIONS .   Contact information: [address] [id number] mc Jodi Atkins [id number] [phone]           [name] 11/05/2015, 12:03 AM

## 2015-11-05 NOTE — MAU Note (Signed)
Here for 2nd dose of betamethasone. No bleeding. No leaking. Baby moving well. Contractions decreased since yesterday.

## 2015-11-07 ENCOUNTER — Inpatient Hospital Stay (HOSPITAL_COMMUNITY)
Admission: AD | Admit: 2015-11-07 | Discharge: 2015-11-07 | Disposition: A | Discharge status: Home / Self Care | Payer: BLUE CROSS/BLUE SHIELD | Source: Ambulatory Visit | Attending: Obstetrics and Gynecology | Admitting: Obstetrics and Gynecology

## 2015-11-07 ENCOUNTER — Encounter (HOSPITAL_COMMUNITY): Payer: Self-pay

## 2015-11-07 DIAGNOSIS — O471 False labor at or after 37 completed weeks of gestation: Secondary | ICD-10-CM | POA: Insufficient documentation

## 2015-11-07 DIAGNOSIS — Z3A00 Weeks of gestation of pregnancy not specified: Secondary | ICD-10-CM | POA: Diagnosis not present

## 2015-11-07 DIAGNOSIS — O4703 False labor before 37 completed weeks of gestation, third trimester: Secondary | ICD-10-CM

## 2015-11-07 DIAGNOSIS — O368130 Decreased fetal movements, third trimester, not applicable or unspecified: Secondary | ICD-10-CM | POA: Diagnosis present

## 2015-11-07 NOTE — MAU Provider Note (Signed)
History   [id number]   Chief Complaint  Patient presents with  . Decreased Fetal Movement    HPI Jodi Atkins is a 25 y.o. female  [phone] here with report of decreased fetal movement since Friday. Had tried eating & pushing on abdomen without improvement of fetal activity. Reports occasional contractions. Was seen for preterm labor 3 days ago. Pt states contractions have continued but are not as painful as they were when she was here the other day. Denies vaginal bleeding or LOF.   Patient's last menstrual period was 02/26/2015.  OB History  Gravida Para Term Preterm AB Living  4 1 1   2 1   SAB TAB Ectopic Multiple Live Births  2       1    # Outcome Date GA Lbr Len/2nd Weight Sex Delivery Anes PTL Lv  4 Current           3 SAB 11/12/14 [redacted]w[redacted]d         2 SAB 09/01/14 [redacted]w[redacted]d         1 Term 05/31/12 [redacted]w[redacted]d 247:32 7 lb 12 oz (3.515 kg) F Vag-Vacuum EPI  LIV      Past Medical History:  Diagnosis Date  . History of chicken pox     Family History  Problem Relation Age of Onset  . Cancer Mother     lymphoma  . Cancer Maternal Grandmother     lymphoma  . Cancer Paternal Grandmother   . Cancer Maternal Aunt     Social History   Social History  . Marital status: Married    Spouse name: N/A  . Number of children: N/A  . Years of education: N/A   Social History Main Topics  . Smoking status: Never Smoker  . Smokeless tobacco: Never Used  . Alcohol use No  . Drug use: No  . Sexual activity: Yes    Birth control/ protection: None   Other Topics Concern  . None   Social History Narrative  . None    Allergies  Allergen Reactions  . Aleve [Naproxen] Hives  . Ibuprofen Hives    No current facility-administered medications on file prior to encounter.    Current Outpatient Prescriptions on File Prior to Encounter  Medication Sig Dispense Refill  . Prenatal Vit-Fe Fumarate-FA (MULTIVITAMIN-PRENATAL) 27-0.8 MG TABS tablet Take 1 tablet by mouth daily.        Review  of Systems  Constitutional: Negative.   Gastrointestinal: Positive for abdominal pain. Negative for nausea and vomiting.  Genitourinary: Negative.      Physical Exam   Vitals:   11/07/15 1231 11/07/15 1303  BP: 114/67 110/57  Pulse: 75 75  Resp: 16 16  Temp: 98.1 F (36.7 C) 98.8 F (37.1 C)  TempSrc: Oral Oral  Weight: 134 lb (60.8 kg)   Height: 5\' 6"  (1.676 m)     Physical Exam  Nursing note and vitals reviewed. Constitutional: She is oriented to person, place, and time. She appears well-developed and well-nourished. No distress.  HENT:  Head: Normocephalic and atraumatic.  Eyes: Conjunctivae are normal. Right eye exhibits no discharge. Left eye exhibits no discharge. No scleral icterus.  Neck: Normal range of motion.  Respiratory: No respiratory distress.  GI: Soft. There is no tenderness.  Neurological: She is alert and oriented to person, place, and time.  Skin: Skin is warm and dry. She is not diaphoretic.  Psychiatric: She has a normal mood and affect. Her behavior is normal. Judgment and thought content  normal.   Fetal Tracing:  Baseline: 125 Variability: moderate Accelerations: 15x15 Decelerations: none  Toco: irregular, palpate mild  Dilation: 1.5 Effacement (%): 40 Cervical Position: Posterior Station: -3 Presentation: Vertex Exam by:: Heloise PurpuraErin Lawerance NP    MAU Course  Procedures No results found for this or any previous visit (from the past 24 hour(s)).  MDM Reactive fetal tracing Pt reports normal fetal movement since arriving to MAU Pt reports feeling ctx but denies pain with them -- cervix unchanged since exam 3 days ago S/w Dr. Henderson CloudHorvath -- ok to discharge home  Assessment and Plan  A: 1. Decreased fetal movement, third trimester, not applicable or unspecified fetus   2. Preterm contractions, third trimester     P: Discharge home Fetal kick count form discussed Discussed reasons to return to MAU Keep f/u with OB   Judeth HornErin Lawrence,  NP 11/07/2015 1:05 PM

## 2015-11-07 NOTE — Discharge Instructions (Signed)
Braxton Hicks Contractions °Contractions of the uterus can occur throughout pregnancy. Contractions are not always a sign that you are in labor.  °WHAT ARE BRAXTON HICKS CONTRACTIONS?  °Contractions that occur before labor are called Braxton Hicks contractions, or false labor. Toward the end of pregnancy (32-34 weeks), these contractions can develop more often and may become more forceful. This is not true labor because these contractions do not result in opening (dilatation) and thinning of the cervix. They are sometimes difficult to tell apart from true labor because these contractions can be forceful and people have different pain tolerances. You should not feel embarrassed if you go to the hospital with false labor. Sometimes, the only way to tell if you are in true labor is for your health care provider to look for changes in the cervix. °If there are no prenatal problems or other health problems associated with the pregnancy, it is completely safe to be sent home with false labor and await the onset of true labor. °HOW CAN YOU TELL THE DIFFERENCE BETWEEN TRUE AND FALSE LABOR? °False Labor °· The contractions of false labor are usually shorter and not as hard as those of true labor.   °· The contractions are usually irregular.   °· The contractions are often felt in the front of the lower abdomen and in the groin.   °· The contractions may go away when you walk around or change positions while lying down.   °· The contractions get weaker and are shorter lasting as time goes on.   °· The contractions do not usually become progressively stronger, regular, and closer together as with true labor.   °True Labor °· Contractions in true labor last 30-70 seconds, become very regular, usually become more intense, and increase in frequency.   °· The contractions do not go away with walking.   °· The discomfort is usually felt in the top of the uterus and spreads to the lower abdomen and low back.   °· True labor can be  determined by your health care provider with an exam. This will show that the cervix is dilating and getting thinner.   °WHAT TO REMEMBER °· Keep up with your usual exercises and follow other instructions given by your health care provider.   °· Take medicines as directed by your health care provider.   °· Keep your regular prenatal appointments.   °· Eat and drink lightly if you think you are going into labor.   °· If Braxton Hicks contractions are making you uncomfortable:   °¨ Change your position from lying down or resting to walking, or from walking to resting.   °¨ Sit and rest in a tub of warm water.   °¨ Drink 2-3 glasses of water. Dehydration may cause these contractions.   °¨ Do slow and deep breathing several times an hour.   °WHEN SHOULD I SEEK IMMEDIATE MEDICAL CARE? °Seek immediate medical care if: °· Your contractions become stronger, more regular, and closer together.   °· You have fluid leaking or gushing from your vagina.   °· You have a fever.   °· You pass blood-tinged mucus.   °· You have vaginal bleeding.   °· You have continuous abdominal pain.   °· You have low back pain that you never had before.   °· You feel your baby's head pushing down and causing pelvic pressure.   °· Your baby is not moving as much as it used to.   °  °This information is not intended to replace advice given to you by your health care provider. Make sure you discuss any questions you have with your health care   provider. °  °Document Released: 02/27/2005 Document Revised: 03/04/2013 Document Reviewed: 12/09/2012 °Elsevier Interactive Patient Education ©2016 [employer] °Fetal Movement Counts °Patient Name: __________________________________________________ Patient Due Date: ____________________ °Performing a fetal movement count is highly recommended in high-risk pregnancies, but it is good for every pregnant woman to do. Your health care provider may ask you to start counting fetal movements at 28 weeks of the  pregnancy. Fetal movements often increase: °· After eating a full meal. °· After physical activity. °· After eating or drinking something sweet or cold. °· At rest. °Pay attention to when you feel the baby is most active. This will help you notice a pattern of your baby's sleep and wake cycles and what factors contribute to an increase in fetal movement. It is important to perform a fetal movement count at the same time each day when your baby is normally most active.  °HOW TO COUNT FETAL MOVEMENTS °1. Find a quiet and comfortable area to sit or lie down on your left side. Lying on your left side provides the best blood and oxygen circulation to your baby. °2. Write down the day and time on a sheet of paper or in a journal. °3. Start counting kicks, flutters, swishes, rolls, or jabs in a 2-hour period. You should feel at least 10 movements within 2 hours. °4. If you do not feel 10 movements in 2 hours, wait 2-3 hours and count again. Look for a change in the pattern or not enough counts in 2 hours. °SEEK MEDICAL CARE IF: °· You feel less than 10 counts in 2 hours, tried twice. °· There is no movement in over an hour. °· The pattern is changing or taking longer each day to reach 10 counts in 2 hours. °· You feel the baby is not moving as he or she usually does. °Date: ____________ Movements: ____________ Start time: ____________ Finish time: ____________  °Date: ____________ Movements: ____________ Start time: ____________ Finish time: ____________ °Date: ____________ Movements: ____________ Start time: ____________ Finish time: ____________ °Date: ____________ Movements: ____________ Start time: ____________ Finish time: ____________ °Date: ____________ Movements: ____________ Start time: ____________ Finish time: ____________ °Date: ____________ Movements: ____________ Start time: ____________ Finish time: ____________ °Date: ____________ Movements: ____________ Start time: ____________ Finish time:  ____________ °Date: ____________ Movements: ____________ Start time: ____________ Finish time: ____________  °Date: ____________ Movements: ____________ Start time: ____________ Finish time: ____________ °Date: ____________ Movements: ____________ Start time: ____________ Finish time: ____________ °Date: ____________ Movements: ____________ Start time: ____________ Finish time: ____________ °Date: ____________ Movements: ____________ Start time: ____________ Finish time: ____________ °Date: ____________ Movements: ____________ Start time: ____________ Finish time: ____________ °Date: ____________ Movements: ____________ Start time: ____________ Finish time: ____________ °Date: ____________ Movements: ____________ Start time: ____________ Finish time: ____________  °Date: ____________ Movements: ____________ Start time: ____________ Finish time: ____________ °Date: ____________ Movements: ____________ Start time: ____________ Finish time: ____________ °Date: ____________ Movements: ____________ Start time: ____________ Finish time: ____________ °Date: ____________ Movements: ____________ Start time: ____________ Finish time: ____________ °Date: ____________ Movements: ____________ Start time: ____________ Finish time: ____________ °Date: ____________ Movements: ____________ Start time: ____________ Finish time: ____________ °Date: ____________ Movements: ____________ Start time: ____________ Finish time: ____________  °Date: ____________ Movements: ____________ Start time: ____________ Finish time: ____________ °Date: ____________ Movements: ____________ Start time: ____________ Finish time: ____________ °Date: ____________ Movements: ____________ Start time: ____________ Finish time: ____________ °Date: ____________ Movements: ____________ Start time: ____________ Finish time: ____________ °Date: ____________ Movements: ____________ Start time: ____________ Finish time: ____________ °Date: ____________ Movements:  ____________ Start time: ____________ Finish time:   ____________ °Date: ____________ Movements: ____________ Start time: ____________ Finish time: ____________  °Date: ____________ Movements: ____________ Start time: ____________ Finish time: ____________ °Date: ____________ Movements: ____________ Start time: ____________ Finish time: ____________ °Date: ____________ Movements: ____________ Start time: ____________ Finish time: ____________ °Date: ____________ Movements: ____________ Start time: ____________ Finish time: ____________ °Date: ____________ Movements: ____________ Start time: ____________ Finish time: ____________ °Date: ____________ Movements: ____________ Start time: ____________ Finish time: ____________ °Date: ____________ Movements: ____________ Start time: ____________ Finish time: ____________  °Date: ____________ Movements: ____________ Start time: ____________ Finish time: ____________ °Date: ____________ Movements: ____________ Start time: ____________ Finish time: ____________ °Date: ____________ Movements: ____________ Start time: ____________ Finish time: ____________ °Date: ____________ Movements: ____________ Start time: ____________ Finish time: ____________ °Date: ____________ Movements: ____________ Start time: ____________ Finish time: ____________ °Date: ____________ Movements: ____________ Start time: ____________ Finish time: ____________ °Date: ____________ Movements: ____________ Start time: ____________ Finish time: ____________  °Date: ____________ Movements: ____________ Start time: ____________ Finish time: ____________ °Date: ____________ Movements: ____________ Start time: ____________ Finish time: ____________ °Date: ____________ Movements: ____________ Start time: ____________ Finish time: ____________ °Date: ____________ Movements: ____________ Start time: ____________ Finish time: ____________ °Date: ____________ Movements: ____________ Start time: ____________ Finish  time: ____________ °Date: ____________ Movements: ____________ Start time: ____________ Finish time: ____________ °Date: ____________ Movements: ____________ Start time: ____________ Finish time: ____________  °Date: ____________ Movements: ____________ Start time: ____________ Finish time: ____________ °Date: ____________ Movements: ____________ Start time: ____________ Finish time: ____________ °Date: ____________ Movements: ____________ Start time: ____________ Finish time: ____________ °Date: ____________ Movements: ____________ Start time: ____________ Finish time: ____________ °Date: ____________ Movements: ____________ Start time: ____________ Finish time: ____________ °Date: ____________ Movements: ____________ Start time: ____________ Finish time: ____________ °  °This information is not intended to replace advice given to you by your health care provider. Make sure you discuss any questions you have with your health care provider. °  °Document Released: 03/29/2006 Document Revised: 03/20/2014 Document Reviewed: 12/25/2011 °Elsevier Interactive Patient Education ©2016 [employer] ° °

## 2015-11-13 ENCOUNTER — Inpatient Hospital Stay (HOSPITAL_COMMUNITY)
Admission: AD | Admit: 2015-11-13 | Discharge: 2015-11-13 | Disposition: A | Discharge status: Home / Self Care | Payer: BLUE CROSS/BLUE SHIELD | Source: Ambulatory Visit | Attending: Obstetrics and Gynecology | Admitting: Obstetrics and Gynecology

## 2015-11-13 ENCOUNTER — Encounter (HOSPITAL_COMMUNITY): Payer: Self-pay | Admitting: *Deleted

## 2015-11-13 DIAGNOSIS — Z3A37 37 weeks gestation of pregnancy: Secondary | ICD-10-CM | POA: Diagnosis not present

## 2015-11-13 DIAGNOSIS — O471 False labor at or after 37 completed weeks of gestation: Secondary | ICD-10-CM | POA: Insufficient documentation

## 2015-11-13 LAB — POCT FERN TEST: POCT FERN TEST: NEGATIVE

## 2015-11-13 NOTE — Discharge Instructions (Signed)
Third Trimester of Pregnancy °The third trimester is from week 29 through week 42, months 7 through 9. The third trimester is a time when the fetus is growing rapidly. At the end of the ninth month, the fetus is about 20 inches in length and weighs 6-10 pounds.  °BODY CHANGES °Your body goes through many changes during pregnancy. The changes vary from woman to woman.  °· Your weight will continue to increase. You can expect to gain 25-35 pounds (11-16 kg) by the end of the pregnancy. °· You may begin to get stretch marks on your hips, abdomen, and breasts. °· You may urinate more often because the fetus is moving lower into your pelvis and pressing on your bladder. °· You may develop or continue to have heartburn as a result of your pregnancy. °· You may develop constipation because certain hormones are causing the muscles that push waste through your intestines to slow down. °· You may develop hemorrhoids or swollen, bulging veins (varicose veins). °· You may have pelvic pain because of the weight gain and pregnancy hormones relaxing your joints between the bones in your pelvis. Backaches may result from overexertion of the muscles supporting your posture. °· You may have changes in your hair. These can include thickening of your hair, rapid growth, and changes in texture. Some women also have hair loss during or after pregnancy, or hair that feels dry or thin. Your hair will most likely return to normal after your baby is born. °· Your breasts will continue to grow and be tender. A yellow discharge may leak from your breasts called colostrum. °· Your belly button may stick out. °· You may feel short of breath because of your expanding uterus. °· You may notice the fetus "dropping," or moving lower in your abdomen. °· You may have a bloody mucus discharge. This usually occurs a few days to a week before labor begins. °· Your cervix becomes thin and soft (effaced) near your due date. °WHAT TO EXPECT AT YOUR PRENATAL  EXAMS  °You will have prenatal exams every 2 weeks until week 36. Then, you will have weekly prenatal exams. During a routine prenatal visit: °· You will be weighed to make sure you and the fetus are growing normally. °· Your blood pressure is taken. °· Your abdomen will be measured to track your baby's growth. °· The fetal heartbeat will be listened to. °· Any test results from the previous visit will be discussed. °· You may have a cervical check near your due date to see if you have effaced. °At around 36 weeks, your caregiver will check your cervix. At the same time, your caregiver will also perform a test on the secretions of the vaginal tissue. This test is to determine if a type of bacteria, Group B streptococcus, is present. Your caregiver will explain this further. °Your caregiver may ask you: °· What your birth plan is. °· How you are feeling. °· If you are feeling the baby move. °· If you have had any abnormal symptoms, such as leaking fluid, bleeding, severe headaches, or abdominal cramping. °· If you are using any tobacco products, including cigarettes, chewing tobacco, and electronic cigarettes. °· If you have any questions. °Other tests or screenings that may be performed during your third trimester include: °· Blood tests that check for low iron levels (anemia). °· Fetal testing to check the health, activity level, and growth of the fetus. Testing is done if you have certain medical conditions or if   there are problems during the pregnancy. °· HIV (human immunodeficiency virus) testing. If you are at high risk, you may be screened for HIV during your third trimester of pregnancy. °FALSE LABOR °You may feel small, irregular contractions that eventually go away. These are called Braxton Hicks contractions, or false labor. Contractions may last for hours, days, or even weeks before true labor sets in. If contractions come at regular intervals, intensify, or become painful, it is best to be seen by your  caregiver.  °SIGNS OF LABOR  °· Menstrual-like cramps. °· Contractions that are 5 minutes apart or less. °· Contractions that start on the top of the uterus and spread down to the lower abdomen and back. °· A sense of increased pelvic pressure or back pain. °· A watery or bloody mucus discharge that comes from the vagina. °If you have any of these signs before the 37th week of pregnancy, call your caregiver right away. You need to go to the hospital to get checked immediately. °HOME CARE INSTRUCTIONS  °· Avoid all smoking, herbs, alcohol, and unprescribed drugs. These chemicals affect the formation and growth of the baby. °· Do not use any tobacco products, including cigarettes, chewing tobacco, and electronic cigarettes. If you need help quitting, ask your health care provider. You may receive counseling support and other resources to help you quit. °· Follow your caregiver's instructions regarding medicine use. There are medicines that are either safe or unsafe to take during pregnancy. °· Exercise only as directed by your caregiver. Experiencing uterine cramps is a good sign to stop exercising. °· Continue to eat regular, healthy meals. °· Wear a good support bra for breast tenderness. °· Do not use hot tubs, steam rooms, or saunas. °· Wear your seat belt at all times when driving. °· Avoid raw meat, uncooked cheese, cat litter boxes, and soil used by cats. These carry germs that can cause birth defects in the baby. °· Take your prenatal vitamins. °· Take 1500-2000 mg of calcium daily starting at the 20th week of pregnancy until you deliver your baby. °· Try taking a stool softener (if your caregiver approves) if you develop constipation. Eat more high-fiber foods, such as fresh vegetables or fruit and whole grains. Drink plenty of fluids to keep your urine clear or pale yellow. °· Take warm sitz baths to soothe any pain or discomfort caused by hemorrhoids. Use hemorrhoid cream if your caregiver approves. °· If  you develop varicose veins, wear support hose. Elevate your feet for 15 minutes, 3-4 times a day. Limit salt in your diet. °· Avoid heavy lifting, wear low heal shoes, and practice good posture. °· Rest a lot with your legs elevated if you have leg cramps or low back pain. °· Visit your dentist if you have not gone during your pregnancy. Use a soft toothbrush to brush your teeth and be gentle when you floss. °· A sexual relationship may be continued unless your caregiver directs you otherwise. °· Do not travel far distances unless it is absolutely necessary and only with the approval of your caregiver. °· Take prenatal classes to understand, practice, and ask questions about the labor and delivery. °· Make a trial run to the hospital. °· Pack your hospital bag. °· Prepare the baby's nursery. °· Continue to go to all your prenatal visits as directed by your caregiver. °SEEK MEDICAL CARE IF: °· You are unsure if you are in labor or if your water has broken. °· You have dizziness. °· You have   mild pelvic cramps, pelvic pressure, or nagging pain in your abdominal area. °· You have persistent nausea, vomiting, or diarrhea. °· You have a bad smelling vaginal discharge. °· You have pain with urination. °SEEK IMMEDIATE MEDICAL CARE IF:  °· You have a fever. °· You are leaking fluid from your vagina. °· You have spotting or bleeding from your vagina. °· You have severe abdominal cramping or pain. °· You have rapid weight loss or gain. °· You have shortness of breath with chest pain. °· You notice sudden or extreme swelling of your face, hands, ankles, feet, or legs. °· You have not felt your baby move in over an hour. °· You have severe headaches that do not go away with medicine. °· You have vision changes. °  °This information is not intended to replace advice given to you by your health care provider. Make sure you discuss any questions you have with your health care provider. °  °Document Released: 02/21/2001 Document  Revised: 03/20/2014 Document Reviewed: 04/30/2012 °Elsevier Interactive Patient Education ©2016 [employer] °Fetal Movement Counts °Patient Name: __________________________________________________ Patient Due Date: ____________________ °Performing a fetal movement count is highly recommended in high-risk pregnancies, but it is good for every pregnant woman to do. Your health care provider may ask you to start counting fetal movements at 28 weeks of the pregnancy. Fetal movements often increase: °· After eating a full meal. °· After physical activity. °· After eating or drinking something sweet or cold. °· At rest. °Pay attention to when you feel the baby is most active. This will help you notice a pattern of your baby's sleep and wake cycles and what factors contribute to an increase in fetal movement. It is important to perform a fetal movement count at the same time each day when your baby is normally most active.  °HOW TO COUNT FETAL MOVEMENTS °1. Find a quiet and comfortable area to sit or lie down on your left side. Lying on your left side provides the best blood and oxygen circulation to your baby. °2. Write down the day and time on a sheet of paper or in a journal. °3. Start counting kicks, flutters, swishes, rolls, or jabs in a 2-hour period. You should feel at least 10 movements within 2 hours. °4. If you do not feel 10 movements in 2 hours, wait 2-3 hours and count again. Look for a change in the pattern or not enough counts in 2 hours. °SEEK MEDICAL CARE IF: °· You feel less than 10 counts in 2 hours, tried twice. °· There is no movement in over an hour. °· The pattern is changing or taking longer each day to reach 10 counts in 2 hours. °· You feel the baby is not moving as he or she usually does. °Date: ____________ Movements: ____________ Start time: ____________ Finish time: ____________  °Date: ____________ Movements: ____________ Start time: ____________ Finish time: ____________ °Date:  ____________ Movements: ____________ Start time: ____________ Finish time: ____________ °Date: ____________ Movements: ____________ Start time: ____________ Finish time: ____________ °Date: ____________ Movements: ____________ Start time: ____________ Finish time: ____________ °Date: ____________ Movements: ____________ Start time: ____________ Finish time: ____________ °Date: ____________ Movements: ____________ Start time: ____________ Finish time: ____________ °Date: ____________ Movements: ____________ Start time: ____________ Finish time: ____________  °Date: ____________ Movements: ____________ Start time: ____________ Finish time: ____________ °Date: ____________ Movements: ____________ Start time: ____________ Finish time: ____________ °Date: ____________ Movements: ____________ Start time: ____________ Finish time: ____________ °Date: ____________ Movements: ____________ Start time: ____________ Finish time: ____________ °Date:   ____________ Movements: ____________ Start time: ____________ Finish time: ____________ °Date: ____________ Movements: ____________ Start time: ____________ Finish time: ____________ °Date: ____________ Movements: ____________ Start time: ____________ Finish time: ____________  °Date: ____________ Movements: ____________ Start time: ____________ Finish time: ____________ °Date: ____________ Movements: ____________ Start time: ____________ Finish time: ____________ °Date: ____________ Movements: ____________ Start time: ____________ Finish time: ____________ °Date: ____________ Movements: ____________ Start time: ____________ Finish time: ____________ °Date: ____________ Movements: ____________ Start time: ____________ Finish time: ____________ °Date: ____________ Movements: ____________ Start time: ____________ Finish time: ____________ °Date: ____________ Movements: ____________ Start time: ____________ Finish time: ____________  °Date: ____________ Movements: ____________ Start  time: ____________ Finish time: ____________ °Date: ____________ Movements: ____________ Start time: ____________ Finish time: ____________ °Date: ____________ Movements: ____________ Start time: ____________ Finish time: ____________ °Date: ____________ Movements: ____________ Start time: ____________ Finish time: ____________ °Date: ____________ Movements: ____________ Start time: ____________ Finish time: ____________ °Date: ____________ Movements: ____________ Start time: ____________ Finish time: ____________ °Date: ____________ Movements: ____________ Start time: ____________ Finish time: ____________  °Date: ____________ Movements: ____________ Start time: ____________ Finish time: ____________ °Date: ____________ Movements: ____________ Start time: ____________ Finish time: ____________ °Date: ____________ Movements: ____________ Start time: ____________ Finish time: ____________ °Date: ____________ Movements: ____________ Start time: ____________ Finish time: ____________ °Date: ____________ Movements: ____________ Start time: ____________ Finish time: ____________ °Date: ____________ Movements: ____________ Start time: ____________ Finish time: ____________ °Date: ____________ Movements: ____________ Start time: ____________ Finish time: ____________  °Date: ____________ Movements: ____________ Start time: ____________ Finish time: ____________ °Date: ____________ Movements: ____________ Start time: ____________ Finish time: ____________ °Date: ____________ Movements: ____________ Start time: ____________ Finish time: ____________ °Date: ____________ Movements: ____________ Start time: ____________ Finish time: ____________ °Date: ____________ Movements: ____________ Start time: ____________ Finish time: ____________ °Date: ____________ Movements: ____________ Start time: ____________ Finish time: ____________ °Date: ____________ Movements: ____________ Start time: ____________ Finish time: ____________    °Date: ____________ Movements: ____________ Start time: ____________ Finish time: ____________ °Date: ____________ Movements: ____________ Start time: ____________ Finish time: ____________ °Date: ____________ Movements: ____________ Start time: ____________ Finish time: ____________ °Date: ____________ Movements: ____________ Start time: ____________ Finish time: ____________ °Date: ____________ Movements: ____________ Start time: ____________ Finish time: ____________ °Date: ____________ Movements: ____________ Start time: ____________ Finish time: ____________ °Date: ____________ Movements: ____________ Start time: ____________ Finish time: ____________  °Date: ____________ Movements: ____________ Start time: ____________ Finish time: ____________ °Date: ____________ Movements: ____________ Start time: ____________ Finish time: ____________ °Date: ____________ Movements: ____________ Start time: ____________ Finish time: ____________ °Date: ____________ Movements: ____________ Start time: ____________ Finish time: ____________ °Date: ____________ Movements: ____________ Start time: ____________ Finish time: ____________ °Date: ____________ Movements: ____________ Start time: ____________ Finish time: ____________ °  °This information is not intended to replace advice given to you by your health care provider. Make sure you discuss any questions you have with your health care provider. °  °Document Released: 03/29/2006 Document Revised: 03/20/2014 Document Reviewed: 12/25/2011 °Elsevier Interactive Patient Education ©2016 [employer] °Braxton Hicks Contractions °Contractions of the uterus can occur throughout pregnancy. Contractions are not always a sign that you are in labor.  °WHAT ARE BRAXTON HICKS CONTRACTIONS?  °Contractions that occur before labor are called Braxton Hicks contractions, or false labor. Toward the end of pregnancy (32-34 weeks), these contractions can develop more often and may become more  forceful. This is not true labor because these contractions do not result in opening (dilatation) and thinning of the cervix. They are sometimes difficult to tell apart from true labor because these contractions can be forceful and people have different pain tolerances. You   should not feel embarrassed if you go to the hospital with false labor. Sometimes, the only way to tell if you are in true labor is for your health care provider to look for changes in the cervix. °If there are no prenatal problems or other health problems associated with the pregnancy, it is completely safe to be sent home with false labor and await the onset of true labor. °HOW CAN YOU TELL THE DIFFERENCE BETWEEN TRUE AND FALSE LABOR? °False Labor °· The contractions of false labor are usually shorter and not as hard as those of true labor.   °· The contractions are usually irregular.   °· The contractions are often felt in the front of the lower abdomen and in the groin.   °· The contractions may go away when you walk around or change positions while lying down.   °· The contractions get weaker and are shorter lasting as time goes on.   °· The contractions do not usually become progressively stronger, regular, and closer together as with true labor.   °True Labor °· Contractions in true labor last 30-70 seconds, become very regular, usually become more intense, and increase in frequency.   °· The contractions do not go away with walking.   °· The discomfort is usually felt in the top of the uterus and spreads to the lower abdomen and low back.   °· True labor can be determined by your health care provider with an exam. This will show that the cervix is dilating and getting thinner.   °WHAT TO REMEMBER °· Keep up with your usual exercises and follow other instructions given by your health care provider.   °· Take medicines as directed by your health care provider.   °· Keep your regular prenatal appointments.   °· Eat and drink lightly if you  think you are going into labor.   °· If Braxton Hicks contractions are making you uncomfortable:   °¨ Change your position from lying down or resting to walking, or from walking to resting.   °¨ Sit and rest in a tub of warm water.   °¨ Drink 2-3 glasses of water. Dehydration may cause these contractions.   °¨ Do slow and deep breathing several times an hour.   °WHEN SHOULD I SEEK IMMEDIATE MEDICAL CARE? °Seek immediate medical care if: °· Your contractions become stronger, more regular, and closer together.   °· You have fluid leaking or gushing from your vagina.   °· You have a fever.   °· You pass blood-tinged mucus.   °· You have vaginal bleeding.   °· You have continuous abdominal pain.   °· You have low back pain that you never had before.   °· You feel your baby's head pushing down and causing pelvic pressure.   °· Your baby is not moving as much as it used to.   °  °This information is not intended to replace advice given to you by your health care provider. Make sure you discuss any questions you have with your health care provider. °  °Document Released: 02/27/2005 Document Revised: 03/04/2013 Document Reviewed: 12/09/2012 °Elsevier Interactive Patient Education ©2016 [employer] ° °

## 2015-11-13 NOTE — MAU Note (Signed)
Pt states she had a gush of fluid around 1500 or 1530.  Pt states right after contractions started.  Pt states the contractions are irregular.  Pt denies bleeding.

## 2015-11-13 NOTE — Progress Notes (Signed)
Dr. Tenny Crawoss notified of pt in MAU.  Notified that pt is a G4P1 at 8917w1d.  Notified that pt came in with complaints of rupture but had no pooling upon speculum exam and was fern negative.  Provider notified of cervical exam of 1.5 cm, 50%.  Provider notified of FHR tracing and asked to review.  Provider states she will review the FHR tracing and call back to MAU.

## 2015-11-13 NOTE — Progress Notes (Signed)
Dr. Tenny Crawoss reviewed FHR tracing.  Provider states 1711 was artifact and not a deceleration.  Provider states pt can be discharged home with labor precautions.

## 2015-12-02 ENCOUNTER — Telehealth (HOSPITAL_COMMUNITY): Payer: Self-pay | Admitting: *Deleted

## 2015-12-02 NOTE — Telephone Encounter (Signed)
Preadmission screen  

## 2015-12-08 ENCOUNTER — Encounter (HOSPITAL_COMMUNITY): Payer: Self-pay | Admitting: *Deleted

## 2015-12-08 ENCOUNTER — Telehealth (HOSPITAL_COMMUNITY): Payer: Self-pay | Admitting: *Deleted

## 2015-12-08 NOTE — Telephone Encounter (Signed)
Preadmission screen  

## 2015-12-09 ENCOUNTER — Inpatient Hospital Stay (HOSPITAL_COMMUNITY): Payer: BLUE CROSS/BLUE SHIELD | Admitting: Anesthesiology

## 2015-12-09 ENCOUNTER — Encounter (HOSPITAL_COMMUNITY): Payer: Self-pay

## 2015-12-09 ENCOUNTER — Inpatient Hospital Stay (HOSPITAL_COMMUNITY)
Admission: RE | Admit: 2015-12-09 | Discharge: 2015-12-11 | DRG: 775 | Disposition: A | Discharge status: Home / Self Care | Payer: BLUE CROSS/BLUE SHIELD | Source: Ambulatory Visit | Attending: Obstetrics | Admitting: Obstetrics

## 2015-12-09 DIAGNOSIS — Z3A40 40 weeks gestation of pregnancy: Secondary | ICD-10-CM | POA: Diagnosis not present

## 2015-12-09 DIAGNOSIS — O480 Post-term pregnancy: Principal | ICD-10-CM | POA: Diagnosis present

## 2015-12-09 LAB — TYPE AND SCREEN
ABO/RH(D): O POS
Antibody Screen: NEGATIVE

## 2015-12-09 LAB — CBC
HEMATOCRIT: 30.1 % — AB (ref 36.0–46.0)
Hemoglobin: 9.9 g/dL — ABNORMAL LOW (ref 12.0–15.0)
MCH: 25.2 pg — ABNORMAL LOW (ref 26.0–34.0)
MCHC: 32.9 g/dL (ref 30.0–36.0)
MCV: 76.6 fL — AB (ref 78.0–100.0)
Platelets: 195 10*3/uL (ref 150–400)
RBC: 3.93 MIL/uL (ref 3.87–5.11)
RDW: 15.3 % (ref 11.5–15.5)
WBC: 8.4 10*3/uL (ref 4.0–10.5)

## 2015-12-09 LAB — RPR: RPR Ser Ql: NONREACTIVE

## 2015-12-09 MED ORDER — FENTANYL 2.5 MCG/ML BUPIVACAINE 1/10 % EPIDURAL INFUSION (WH - ANES)
14.0000 mL/h | INTRAMUSCULAR | Status: DC | PRN
  Administered 2015-12-09 (×2): 14 mL/h via EPIDURAL
  Filled 2015-12-09: qty 125

## 2015-12-09 MED ORDER — LIDOCAINE HCL (PF) 1 % IJ SOLN
30.0000 mL | INTRAMUSCULAR | Status: DC | PRN
  Filled 2015-12-09: qty 30

## 2015-12-09 MED ORDER — BUTORPHANOL TARTRATE 1 MG/ML IJ SOLN: 1.0000 mg | INTRAMUSCULAR | Status: DC | PRN

## 2015-12-09 MED ORDER — ACETAMINOPHEN 325 MG PO TABS
650.0000 mg | ORAL_TABLET | Freq: Four times a day (QID) | ORAL | Status: DC | PRN
  Administered 2015-12-10 – 2015-12-11 (×6): 650 mg via ORAL
  Filled 2015-12-09 (×7): qty 2

## 2015-12-09 MED ORDER — DIPHENHYDRAMINE HCL 50 MG/ML IJ SOLN: 12.5000 mg | INTRAMUSCULAR | Status: DC | PRN

## 2015-12-09 MED ORDER — DIPHENHYDRAMINE HCL 25 MG PO CAPS: 25.0000 mg | ORAL_CAPSULE | Freq: Four times a day (QID) | ORAL | Status: DC | PRN

## 2015-12-09 MED ORDER — TERBUTALINE SULFATE 1 MG/ML IJ SOLN
0.2500 mg | Freq: Once | INTRAMUSCULAR | Status: DC | PRN
  Filled 2015-12-09: qty 1

## 2015-12-09 MED ORDER — LIDOCAINE HCL (PF) 1 % IJ SOLN
INTRAMUSCULAR | Status: DC | PRN
  Administered 2015-12-09 (×2): 4 mL via EPIDURAL

## 2015-12-09 MED ORDER — ONDANSETRON HCL 4 MG/2ML IJ SOLN: 4.0000 mg | Freq: Four times a day (QID) | INTRAMUSCULAR | Status: DC | PRN

## 2015-12-09 MED ORDER — OXYCODONE-ACETAMINOPHEN 5-325 MG PO TABS: 1.0000 | ORAL_TABLET | ORAL | Status: DC | PRN

## 2015-12-09 MED ORDER — PHENYLEPHRINE 40 MCG/ML (10ML) SYRINGE FOR IV PUSH (FOR BLOOD PRESSURE SUPPORT)
80.0000 ug | PREFILLED_SYRINGE | INTRAVENOUS | Status: DC | PRN
  Filled 2015-12-09: qty 5

## 2015-12-09 MED ORDER — OXYCODONE-ACETAMINOPHEN 5-325 MG PO TABS: 2.0000 | ORAL_TABLET | ORAL | Status: DC | PRN

## 2015-12-09 MED ORDER — TETANUS-DIPHTH-ACELL PERTUSSIS 5-2.5-18.5 LF-MCG/0.5 IM SUSP: 0.5000 mL | Freq: Once | INTRAMUSCULAR | Status: DC

## 2015-12-09 MED ORDER — LACTATED RINGERS IV SOLN
INTRAVENOUS | Status: DC
  Administered 2015-12-09: 12:00:00 via INTRAVENOUS

## 2015-12-09 MED ORDER — PRENATAL MULTIVITAMIN CH
1.0000 | ORAL_TABLET | Freq: Every day | ORAL | Status: DC
  Administered 2015-12-10 – 2015-12-11 (×2): 1 via ORAL
  Filled 2015-12-09 (×2): qty 1

## 2015-12-09 MED ORDER — OXYTOCIN 40 UNITS IN LACTATED RINGERS INFUSION - SIMPLE MED
2.5000 [IU]/h | INTRAVENOUS | Status: DC
Start: 2015-12-09 — End: 2015-12-09

## 2015-12-09 MED ORDER — LACTATED RINGERS IV SOLN
500.0000 mL | Freq: Once | INTRAVENOUS | Status: AC
  Administered 2015-12-09: 500 mL via INTRAVENOUS

## 2015-12-09 MED ORDER — PHENYLEPHRINE 40 MCG/ML (10ML) SYRINGE FOR IV PUSH (FOR BLOOD PRESSURE SUPPORT)
80.0000 ug | PREFILLED_SYRINGE | INTRAVENOUS | Status: DC | PRN
  Filled 2015-12-09: qty 5
  Filled 2015-12-09: qty 10

## 2015-12-09 MED ORDER — OXYTOCIN BOLUS FROM INFUSION
500.0000 mL | Freq: Once | INTRAVENOUS | Status: AC
  Administered 2015-12-09: 500 mL via INTRAVENOUS

## 2015-12-09 MED ORDER — DIBUCAINE 1 % RE OINT: 1.0000 "application " | TOPICAL_OINTMENT | RECTAL | Status: DC | PRN

## 2015-12-09 MED ORDER — ONDANSETRON HCL 4 MG PO TABS: 4.0000 mg | ORAL_TABLET | ORAL | Status: DC | PRN

## 2015-12-09 MED ORDER — COCONUT OIL OIL
1.0000 "application " | TOPICAL_OIL | Status: DC | PRN
  Administered 2015-12-10: 1 via TOPICAL
  Filled 2015-12-09: qty 120

## 2015-12-09 MED ORDER — ONDANSETRON HCL 4 MG/2ML IJ SOLN: 4.0000 mg | INTRAMUSCULAR | Status: DC | PRN

## 2015-12-09 MED ORDER — WITCH HAZEL-GLYCERIN EX PADS
1.0000 | MEDICATED_PAD | CUTANEOUS | Status: DC | PRN
Start: 2015-12-09 — End: 2015-12-11

## 2015-12-09 MED ORDER — LACTATED RINGERS IV SOLN
500.0000 mL | INTRAVENOUS | Status: DC | PRN
  Administered 2015-12-09: 500 mL via INTRAVENOUS

## 2015-12-09 MED ORDER — OXYCODONE HCL 5 MG PO TABS
5.0000 mg | ORAL_TABLET | ORAL | Status: DC | PRN
  Administered 2015-12-11: 5 mg via ORAL
  Filled 2015-12-09 (×2): qty 1

## 2015-12-09 MED ORDER — OXYCODONE HCL 5 MG PO TABS: 10.0000 mg | ORAL_TABLET | ORAL | Status: DC | PRN

## 2015-12-09 MED ORDER — EPHEDRINE 5 MG/ML INJ
10.0000 mg | INTRAVENOUS | Status: DC | PRN
  Filled 2015-12-09: qty 4

## 2015-12-09 MED ORDER — ACETAMINOPHEN 325 MG PO TABS: 650.0000 mg | ORAL_TABLET | ORAL | Status: DC | PRN

## 2015-12-09 MED ORDER — SENNOSIDES-DOCUSATE SODIUM 8.6-50 MG PO TABS
2.0000 | ORAL_TABLET | ORAL | Status: DC
  Administered 2015-12-09 – 2015-12-11 (×2): 2 via ORAL
  Filled 2015-12-09 (×3): qty 2

## 2015-12-09 MED ORDER — OXYTOCIN 40 UNITS IN LACTATED RINGERS INFUSION - SIMPLE MED
1.0000 m[IU]/min | INTRAVENOUS | Status: DC
  Administered 2015-12-09: 2 m[IU]/min via INTRAVENOUS
  Filled 2015-12-09: qty 1000

## 2015-12-09 MED ORDER — BENZOCAINE-MENTHOL 20-0.5 % EX AERO
1.0000 "application " | INHALATION_SPRAY | CUTANEOUS | Status: DC | PRN
  Administered 2015-12-09: 1 via TOPICAL
  Filled 2015-12-09: qty 56

## 2015-12-09 MED ORDER — SIMETHICONE 80 MG PO CHEW: 80.0000 mg | CHEWABLE_TABLET | ORAL | Status: DC | PRN

## 2015-12-09 MED ORDER — SOD CITRATE-CITRIC ACID 500-334 MG/5ML PO SOLN: 30.0000 mL | ORAL | Status: DC | PRN

## 2015-12-09 NOTE — H&P (Signed)
25 y.o. [id number] @ 7019w6d presents for IOL for post term pregnancy.  Otherwise has good fetal movement and no bleeding.  Past Medical History:  Diagnosis Date  . History of chicken pox     Past Surgical History:  Procedure Laterality Date  . NO PAST SURGERIES      OB History  Gravida Para Term Preterm AB Living  4 1 1   2 1   SAB TAB Ectopic Multiple Live Births  2       1    # Outcome Date GA Lbr Len/2nd Weight Sex Delivery Anes PTL Lv  4 Current           3 SAB 11/12/14 7413w3d         2 SAB 09/01/14 7358w2d         1 Term 05/31/12 5760w0d 247:32 3.515 kg (7 lb 12 oz) F Vag-Vacuum EPI  LIV      Social History   Social History  . Marital status: Married    Spouse name: N/A  . Number of children: N/A  . Years of education: N/A   Occupational History  . Not on file.   Social History Main Topics  . Smoking status: Never Smoker  . Smokeless tobacco: Never Used  . Alcohol use No  . Drug use: No  . Sexual activity: Yes    Birth control/ protection: None   Other Topics Concern  . Not on file   Social History Narrative  . No narrative on file   Aleve [naproxen] and Ibuprofen --HIVES  Prenatal Transfer Tool  Maternal Diabetes: No Genetic Screening: Declined Maternal Ultrasounds/Referrals: Normal Fetal Ultrasounds or other Referrals:  None Maternal Substance Abuse:  No Significant Maternal Medications:  None Significant Maternal Lab Results: Lab values include: Group B Strep negative  ABO, Rh: O/Positive/-- (03/15 0000) Antibody: Negative (03/15 0000) Rubella: Immune RPR: Nonreactive (03/15 0000)  HBsAg: Negative (03/15 0000)  HIV: Non-reactive (03/15 0000)  GBS: Negative (08/23 0000)    Other PNC: uncomplicated.    Vitals:   12/09/15 0757  BP: 126/70  Pulse: 76  Resp: 18  Temp: 98.3 F (36.8 C)     General:  NAD Abdomen:  soft, gravid, EFW 6.5-7# Ex:  no edema SVE:  4/70/-2, AROM clear fluid FHTs:  130s, mod var, + accels, no decels, Cat 1 Toco:   quiet   A/P   25 y.o. [id number] 4219w6d presents for IOL for post term pregnancy IOL--pitocin Epidural prn  FSR/ vtx/ GBS negative  Marshall Kampf GEFFEL Yeilyn Gent

## 2015-12-09 NOTE — Anesthesia Pain Management Evaluation Note (Signed)
  [facility] Pain Management Visit Note  Patient: Jodi Atkins, 25 y.o., female  "Hello I am a member of the anesthesia team at [facility]. We have an anesthesia team available at all times to provide care throughout the hospital, including epidural management and anesthesia for C-section. I don't know your plan for the delivery whether it a natural birth, water birth, IV sedation, nitrous supplementation, doula or epidural, but we want to meet your pain goals."   1.Was your pain managed to your expectations on prior hospitalizations?   Yes   2.What is your expectation for pain management during this hospitalization?     Epidural  3.How can we help you reach that goal? Epidural in situ.  Record the patient's initial score and the patient's pain goal.   Pain: No pain, but patient states pressure is 4/10.  Patient states she is satisfied with epidural pain control.  Pain Goal: 4 [facility] wants you to be able to say your pain was always managed very well.  Sheryll Dymek L 12/09/2015

## 2015-12-09 NOTE — Anesthesia Procedure Notes (Signed)
Epidural Patient location during procedure: OB Start time: 12/09/2015 4:45 PM  Staffing Anesthesiologist: Mal AmabileFOSTER, Dalaya Suppa Performed: anesthesiologist   Preanesthetic Checklist Completed: patient identified, site marked, surgical consent, pre-op evaluation, timeout performed, IV checked, risks and benefits discussed and monitors and equipment checked  Epidural Patient position: sitting Prep: site prepped and draped and DuraPrep Patient monitoring: continuous pulse ox and blood pressure Approach: midline Injection technique: LOR air  Needle:  Needle type: Tuohy  Needle gauge: 17 G Needle length: 9 cm and 9 Needle insertion depth: 4 cm Catheter type: closed end flexible Catheter size: 19 Gauge Catheter at skin depth: 9 cm Test dose: negative and Other  Assessment Events: blood not aspirated, injection not painful, no injection resistance, negative IV test and no paresthesia  Additional Notes Patient identified. Risks and benefits discussed including failed block, incomplete  Pain control, post dural puncture headache, nerve damage, paralysis, blood pressure Changes, nausea, vomiting, reactions to medications-both toxic and allergic and post Partum back pain. All questions were answered. Patient expressed understanding and wished to proceed. Sterile technique was used throughout procedure. Epidural site was Dressed with sterile barrier dressing. No paresthesias, signs of intravascular injection Or signs of intrathecal spread were encountered.  Patient was more comfortable after the epidural was dosed. Please see RN's note for documentation of vital signs and FHR which are stable.

## 2015-12-09 NOTE — Anesthesia Preprocedure Evaluation (Signed)
Anesthesia Evaluation  Patient identified by MRN, date of birth, ID band Patient awake, Patient confused and Patient unresponsive    Airway Mallampati: III  TM Distance: >3 FB Neck ROM: Full    Dental no notable dental hx. (+) Teeth Intact   Pulmonary neg pulmonary ROS,    Pulmonary exam normal breath sounds clear to auscultation       Cardiovascular negative cardio ROS Normal cardiovascular exam Rhythm:Regular Rate:Normal     Neuro/Psych negative neurological ROS  negative psych ROS   GI/Hepatic negative GI ROS, Neg liver ROS,   Endo/Other  negative endocrine ROS  Renal/GU negative Renal ROS  negative genitourinary   Musculoskeletal negative musculoskeletal ROS (+)   Abdominal   Peds  Hematology  (+) anemia ,   Anesthesia Other Findings   Reproductive/Obstetrics                             Anesthesia Physical Anesthesia Plan  ASA: II  Anesthesia Plan: Epidural   Post-op Pain Management:    Induction:   Airway Management Planned: Natural Airway  Additional Equipment:   Intra-op Plan:   Post-operative Plan:   Informed Consent: I have reviewed the patients History and Physical, chart, labs and discussed the procedure including the risks, benefits and alternatives for the proposed anesthesia with the patient or authorized representative who has indicated his/her understanding and acceptance.     Plan Discussed with: Anesthesiologist  Anesthesia Plan Comments:         Anesthesia Quick Evaluation

## 2015-12-10 LAB — CBC
HCT: 29.7 % — ABNORMAL LOW (ref 36.0–46.0)
Hemoglobin: 9.6 g/dL — ABNORMAL LOW (ref 12.0–15.0)
MCH: 24.9 pg — ABNORMAL LOW (ref 26.0–34.0)
MCHC: 32.3 g/dL (ref 30.0–36.0)
MCV: 76.9 fL — ABNORMAL LOW (ref 78.0–100.0)
Platelets: 204 10*3/uL (ref 150–400)
RBC: 3.86 MIL/uL — ABNORMAL LOW (ref 3.87–5.11)
RDW: 15.5 % (ref 11.5–15.5)
WBC: 11 10*3/uL — ABNORMAL HIGH (ref 4.0–10.5)

## 2015-12-10 NOTE — Lactation Note (Signed)
This note was copied from a baby's chart. Lactation Consultation Note Initial visit at 24 hours of age.  Mom reports good feedings and baby has had a circ this morning with a feeding after that.  Baby is now sleepy, STS with mom. Lc instructed mom on hand expression with a few drops expressed.  Mom has small everted nipples and encouraged mom to make sure baby is getting deep latch with feedings.  Previous LATCH scores [facility] "8-9" [facility] resources given and discussed.  Encouraged to feed with early cues on demand.  Early newborn behavior discussed.  Mom to call for assist as needed.     Patient Name: Jodi Atkins VZDGL'OToday's Date: 12/10/2015 Reason for consult: Initial assessment   Maternal Data Has patient been taught Hand Expression?: Yes Does the patient have breastfeeding experience prior to this delivery?: Yes  Feeding Feeding Type: Breast Fed  LATCH Score/Interventions Latch: Too sleepy or reluctant, no latch achieved, no sucking elicited.  Audible Swallowing: None Intervention(s): Skin to skin;Hand expression;Alternate breast massage  Type of Nipple: Everted at rest and after stimulation  Comfort (Breast/Nipple): Soft / non-tender     Hold (Positioning): Assistance needed to correctly position infant at breast and maintain latch.  LATCH Score: 5  Lactation Tools Discussed/Used     Consult Status Consult Status: Follow-up Date: 12/11/15 Follow-up type: In-patient    Jannifer RodneyShoptaw, Exavier Lina Lynn 12/10/2015, 4:22 PM

## 2015-12-10 NOTE — Progress Notes (Signed)
Patient is eating, ambulating, voiding.  Pain control is good.  Approproriate lochia, no complaints.  Vitals:   12/09/15 1659 12/09/15 1800 12/09/15 2153 12/10/15 0558  BP: 120/67 123/66 118/66 117/70  Pulse: 73 70 74 70  Resp: 18 18 20 18   Temp: 98.9 F (37.2 C) 98.2 F (36.8 C) 98 F (36.7 C) 98.2 F (36.8 C)  TempSrc: Oral Oral    SpO2: 100%     Weight:      Height:        Fundus firm Perineum without swelling.  Lab Results  Component Value Date   WBC 11.0 (H) 12/10/2015   HGB 9.6 (L) 12/10/2015   HCT 29.7 (L) 12/10/2015   MCV 76.9 (L) 12/10/2015   PLT 204 12/10/2015    --/--/O POS (09/28 0845)  A/P Post partum day 1. Circ desired, consent obtained, circ performed.  Routine care.  Expect d/c 9/30.    Philip AspenALLAHAN, Katlyne Nishida

## 2015-12-10 NOTE — Anesthesia Postprocedure Evaluation (Addendum)
Anesthesia Post Note  Patient: Jodi Atkins  Procedure(s) Performed: * No procedures listed *  Patient location during evaluation: Mother Baby Level of consciousness: awake Pain management: pain level controlled Vital Signs Assessment: post-procedure vital signs reviewed and stable Respiratory status: spontaneous breathing Cardiovascular status: stable Postop Assessment: no headache, no backache, epidural receding and patient able to bend at knees Anesthetic complications: no     Last Vitals:  Vitals:   12/09/15 2153 12/10/15 0558  BP: 118/66 117/70  Pulse: 74 70  Resp: 20 18  Temp: 36.7 C 36.8 C    Last Pain:  Vitals:   12/10/15 0645  TempSrc:   PainSc: 2    Pain Goal:                 Edison PaceWILKERSON,[name]

## 2015-12-11 NOTE — Lactation Note (Signed)
This note was copied from a baby's chart. Lactation Consultation Note  Mom reports nipple tenderness. Assisted her with supporting the baby, better alignment and an off-center latch. She reported decreased pain and many swallows were heard.  Hand expression, Engorgement prevention and relief reviewed as was expected output.  Informed of support groups and outpatient services. Patient Name: Jodi Atkins Care OZDGU'YToday's Date: 12/11/2015 Reason for consult: Follow-up assessment   Maternal Data Has patient been taught Hand Expression?: Yes Does the patient have breastfeeding experience prior to this delivery?: Yes  Feeding Feeding Type: Breast Fed Length of feed: 8 min  LATCH Score/Interventions Latch: Repeated attempts needed to sustain latch, nipple held in mouth throughout feeding, stimulation needed to elicit sucking reflex.  Audible Swallowing: A few with stimulation  Type of Nipple: Everted at rest and after stimulation  Comfort (Breast/Nipple): Filling, red/small blisters or bruises, mild/mod discomfort     Hold (Positioning): Assistance needed to correctly position infant at breast and maintain latch.  LATCH Score: 6  Lactation Tools Discussed/Used     Consult Status Consult Status: PRN    Jodi Atkins, Jodi Atkins 12/11/2015, 9:48 AM

## 2015-12-11 NOTE — Discharge Summary (Signed)
Obstetric Discharge Summary Reason for Admission: induction of labor and post term Prenatal Procedures: ultrasound Intrapartum Procedures: spontaneous vaginal delivery Postpartum Procedures: none Complications-Operative and Postpartum: 2nd degree perineal laceration Hemoglobin  Date Value Ref Range Status  12/10/2015 9.6 (L) 12.0 - 15.0 g/dL Final   HCT  Date Value Ref Range Status  12/10/2015 29.7 (L) 36.0 - 46.0 % Final    Physical Exam:  General: alert and cooperative Lochia: appropriate Uterine Fundus: firm DVT Evaluation: No evidence of DVT seen on physical exam.  Discharge Diagnoses: Term Pregnancy-delivered  Discharge Information: Date: 12/11/2015 Activity: pelvic rest Diet: routine Medications: PNV and tylenol Condition: improved Instructions: refer to practice specific booklet Discharge to: home Follow-up Information    [facility] Jodi GEFFEL CLARK, MD Follow up in 4 week(s).   Specialty:  Obstetrics Contact information: [address] [place] [address] [phone]           Newborn Data: Live born female  Birth Weight: 7 lb 10.9 oz (3485 g) APGAR: 8, 9  Home with mother.  Jodi Atkins, Jodi Atkins 12/11/2015, 10:21 AM

## 2016-02-28 IMAGING — CR DG CHEST 2V
2 series · 2 of 2 positions shown · non-contrast
Comparison: 07/11/2012

CLINICAL DATA: Chest pain.

EXAM:
CHEST  2 VIEW

[w chest pa]
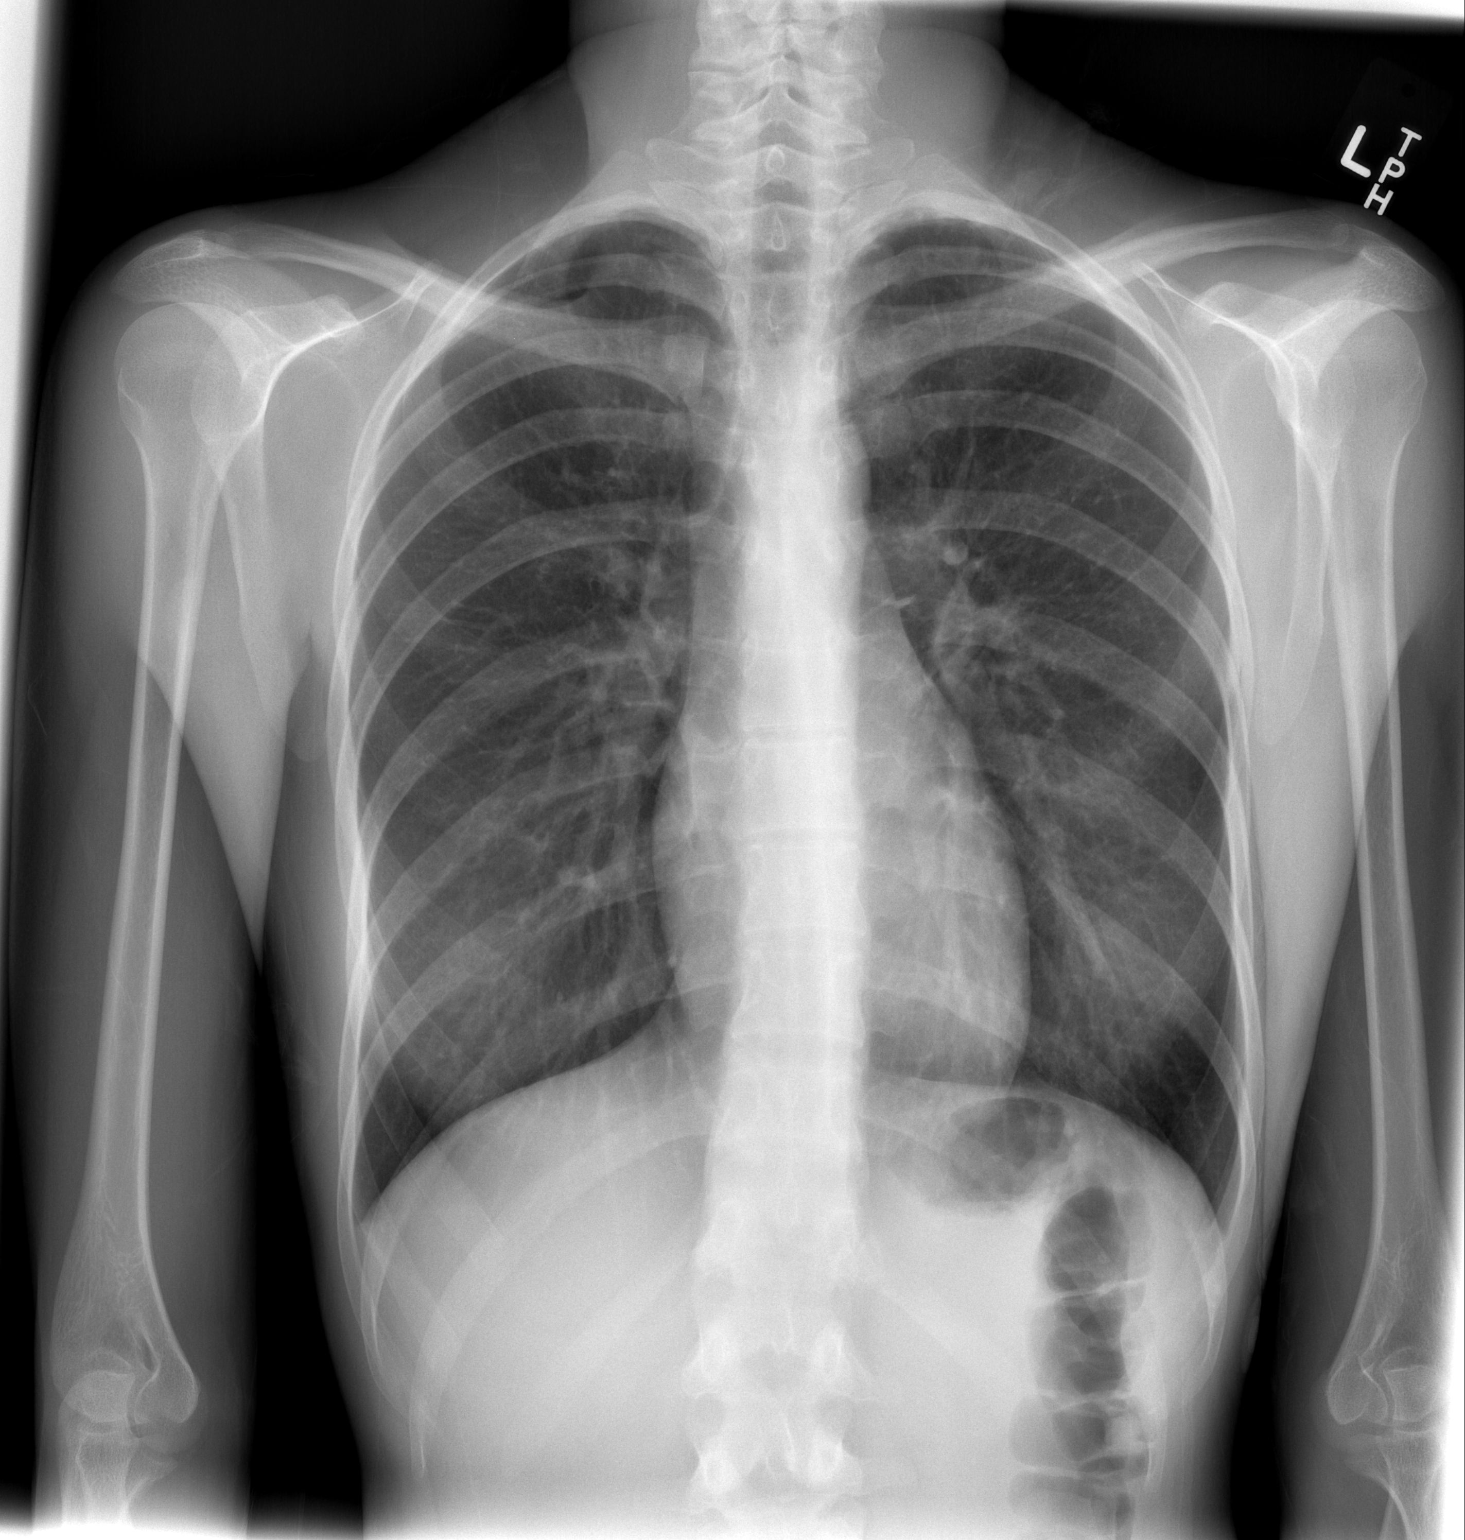

[w chest lat]
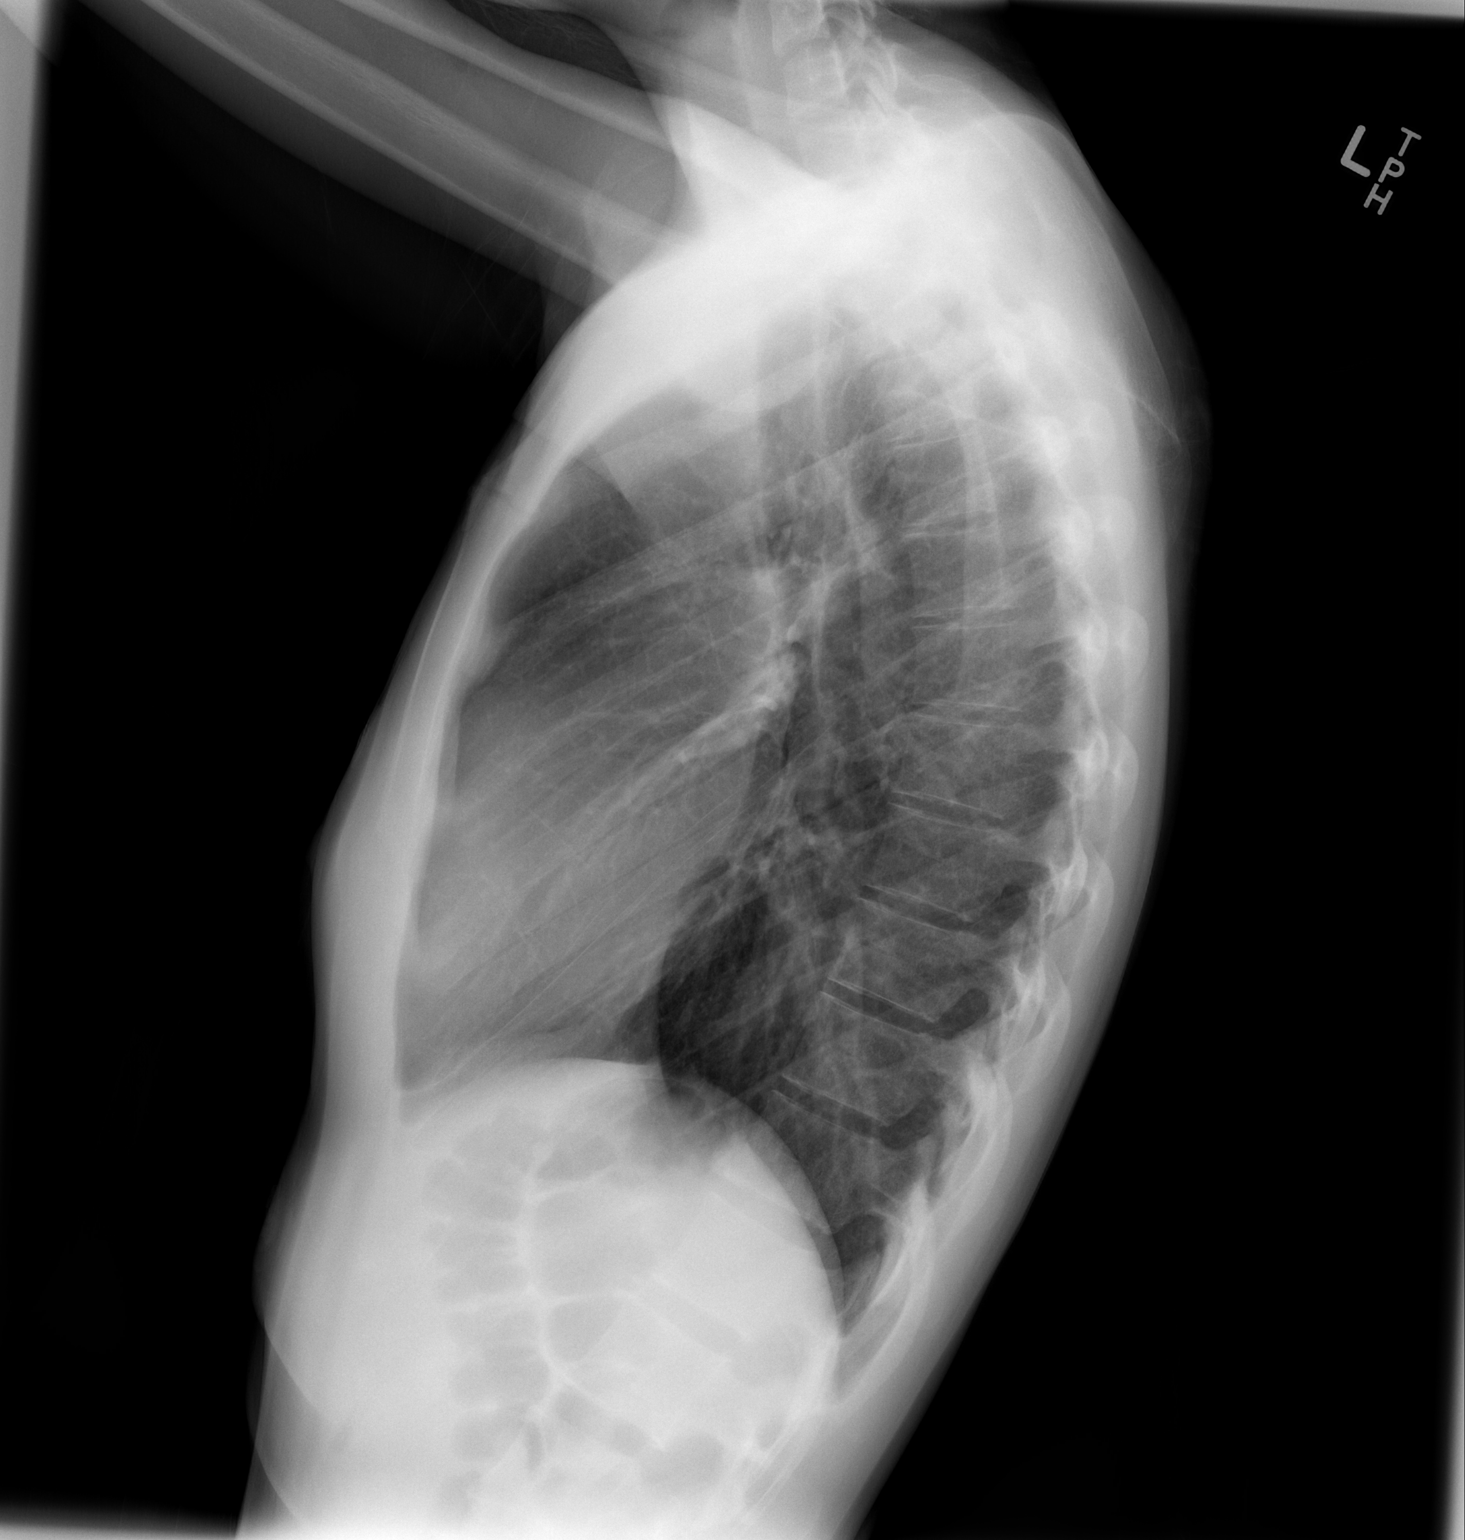

[2 of 2 positions shown; findings below may reference images not displayed]

FINDINGS: The heart size and mediastinal contours are within normal limits.
Both lungs are clear. The visualized skeletal structures are
unremarkable.

There has been appreciable  weight loss since the prior exam.
IMPRESSION: No acute abnormality.  Weight loss since the prior exam.

## 2017-10-04 LAB — OB RESULTS CONSOLE RUBELLA ANTIBODY, IGM: Rubella: IMMUNE

## 2017-10-04 LAB — OB RESULTS CONSOLE HEPATITIS B SURFACE ANTIGEN: Hepatitis B Surface Ag: NEGATIVE

## 2017-10-04 LAB — OB RESULTS CONSOLE GC/CHLAMYDIA
Chlamydia: NEGATIVE
Gonorrhea: NEGATIVE

## 2017-10-04 LAB — OB RESULTS CONSOLE HIV ANTIBODY (ROUTINE TESTING): HIV: NONREACTIVE

## 2017-10-04 LAB — OB RESULTS CONSOLE ANTIBODY SCREEN: Antibody Screen: NEGATIVE

## 2017-10-04 LAB — OB RESULTS CONSOLE RPR: RPR: NONREACTIVE

## 2017-12-10 IMAGING — US US OB COMP LESS 14 WK
1 series · 15 of 21 positions shown · non-contrast
Comparison: None.

CLINICAL DATA: Vaginal bleeding during first trimester pregnancy.
Assigned gestational age of 11 weeks 1 day by prior office
ultrasound.

EXAM:
OBSTETRIC <14 WK ULTRASOUND
TECHNIQUE: Transabdominal ultrasound was performed for evaluation of the
gestation as well as the maternal uterus and adnexal regions.

[Series 1: us ob comp less 14 wk · 15 of 21 slices shown]
[im 1/21]
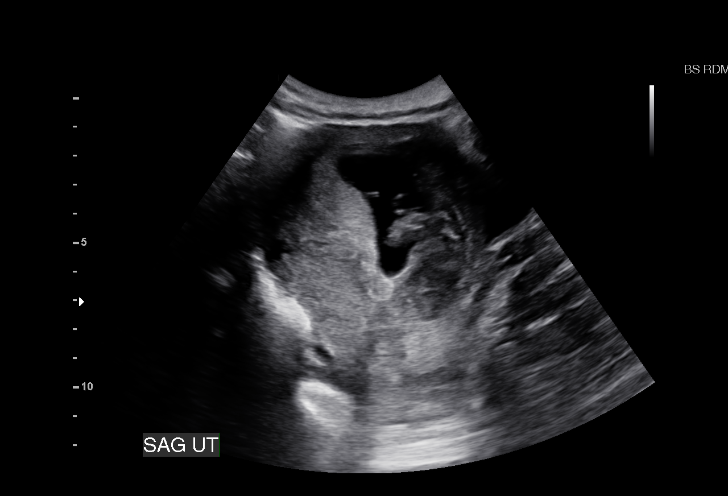
[im 3/21]
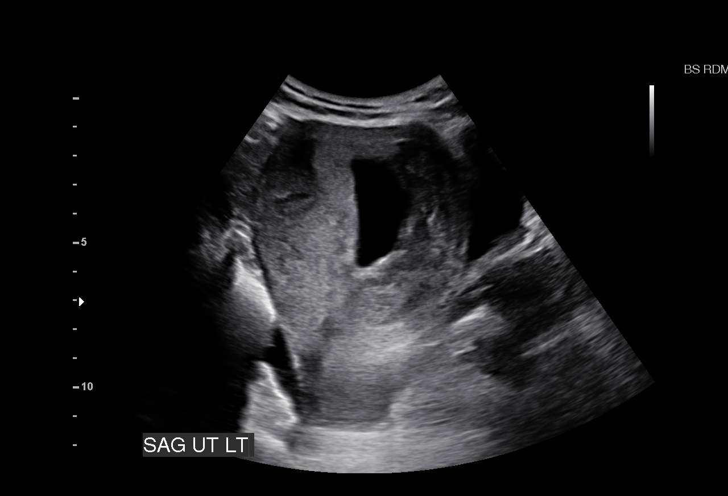
[im 4/21]
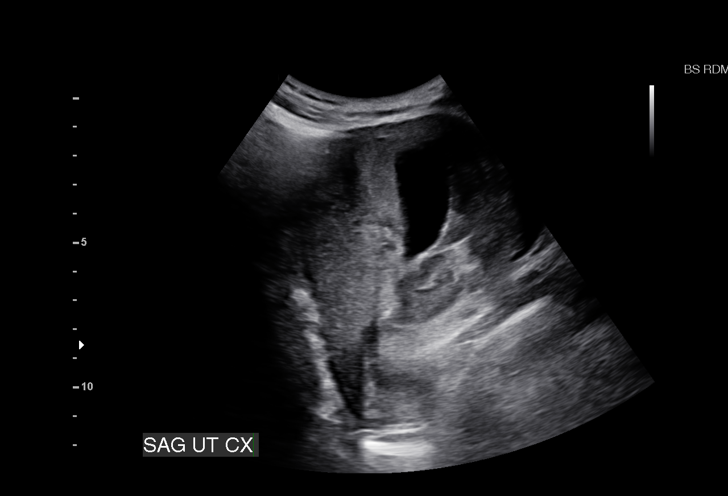
[im 5/21]
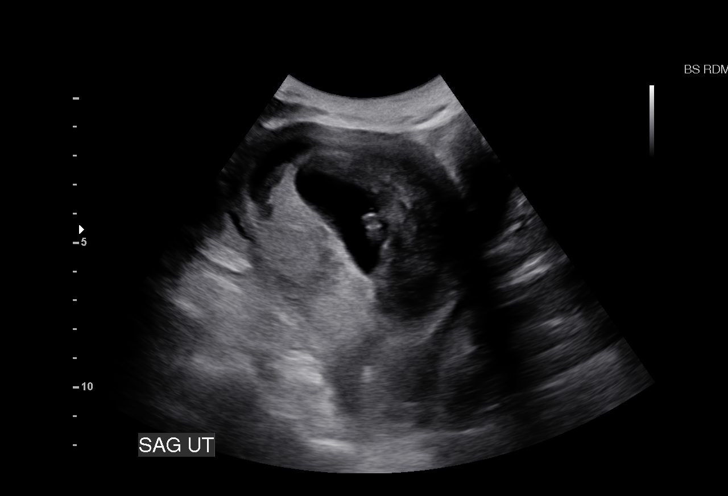
[im 7/21]
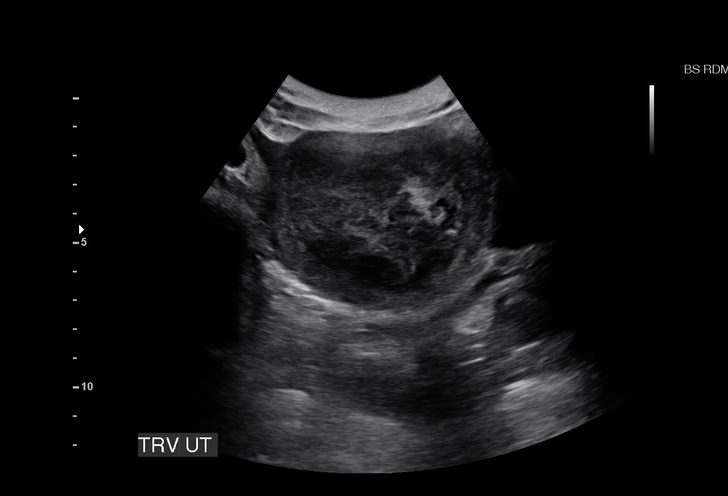
[im 8/21]
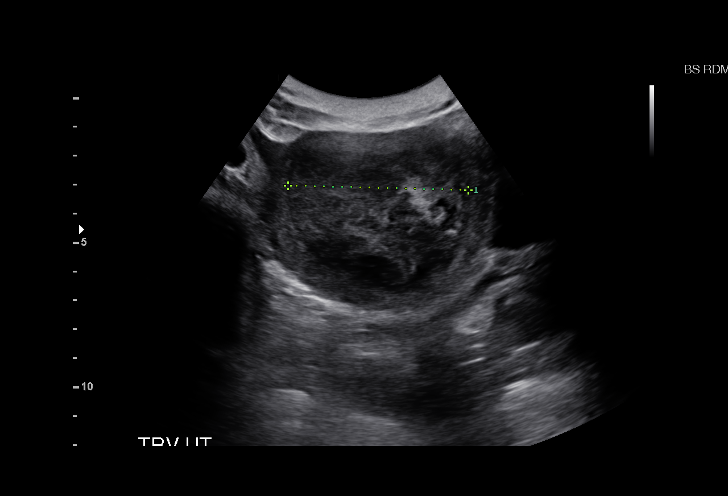
[im 10/21]
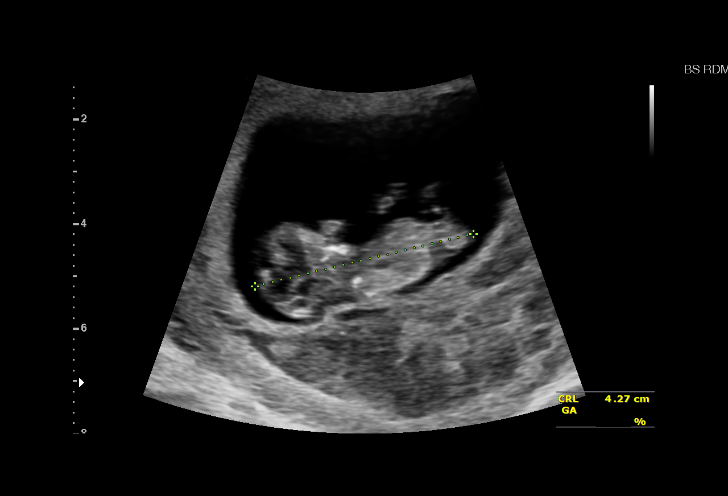
[im 11/21]
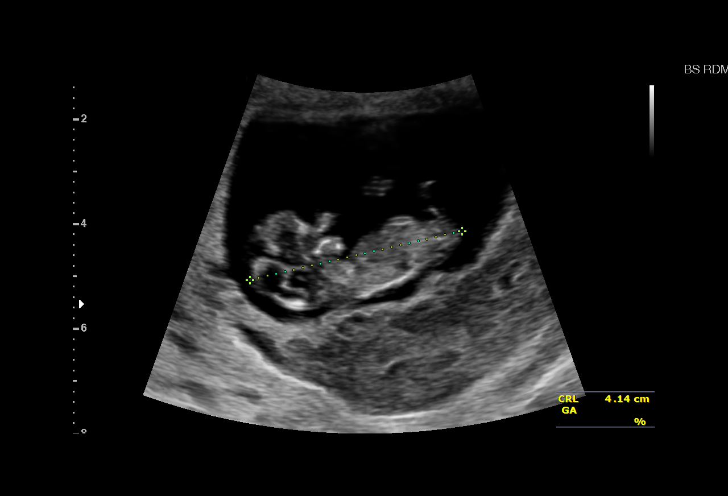
[im 12/21]
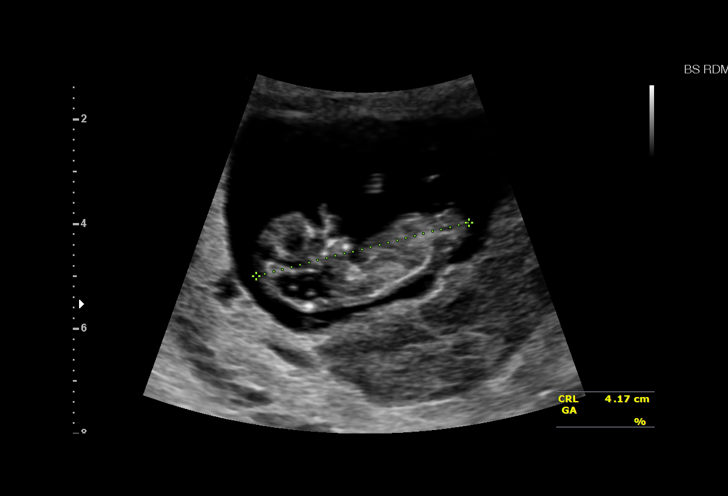
[im 14/21]
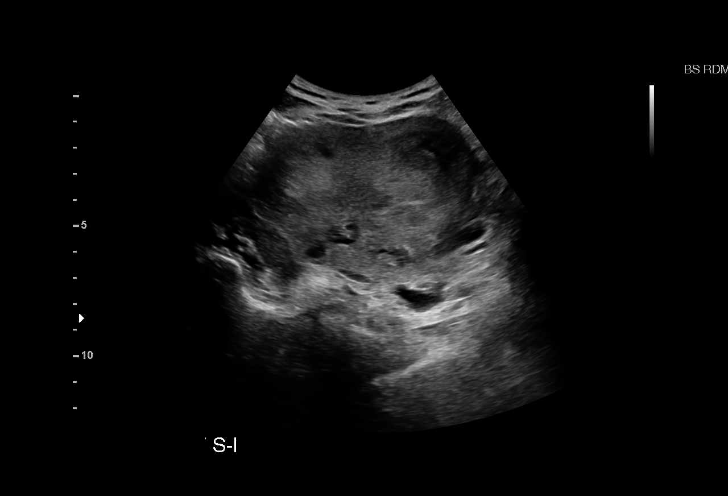
[im 15/21]
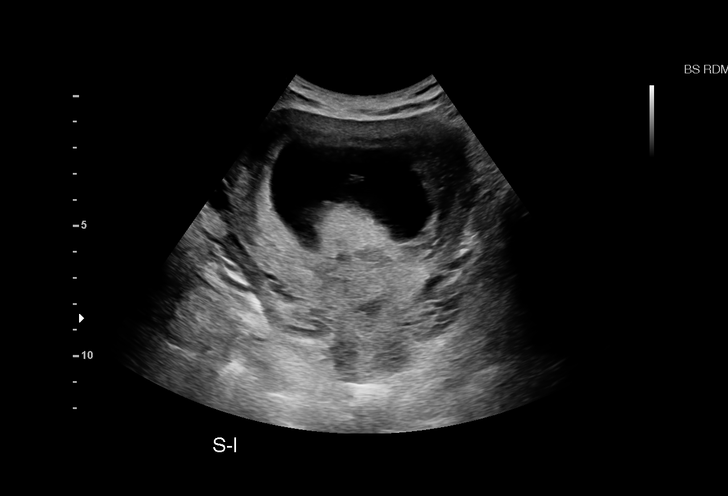
[im 17/21]
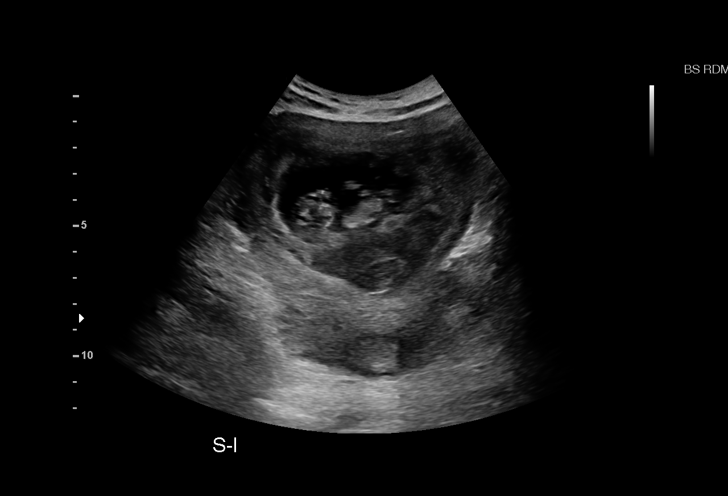
[im 18/21]
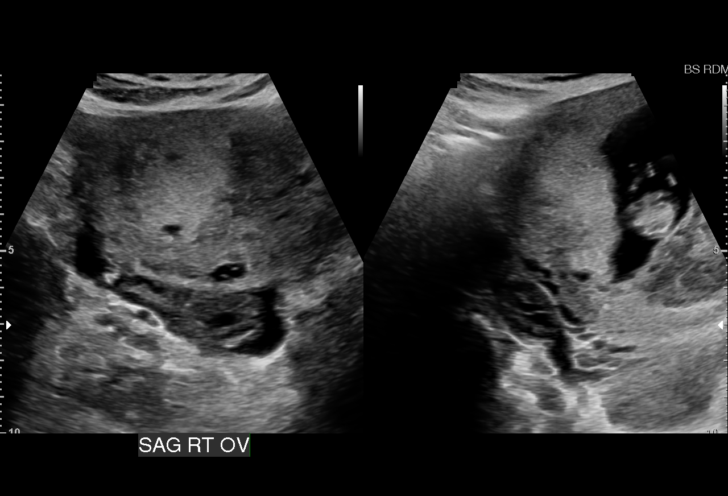
[im 19/21]
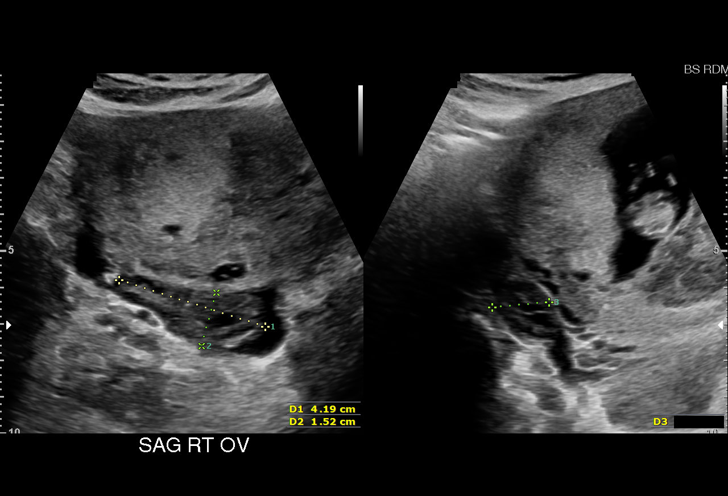
[im 21/21]
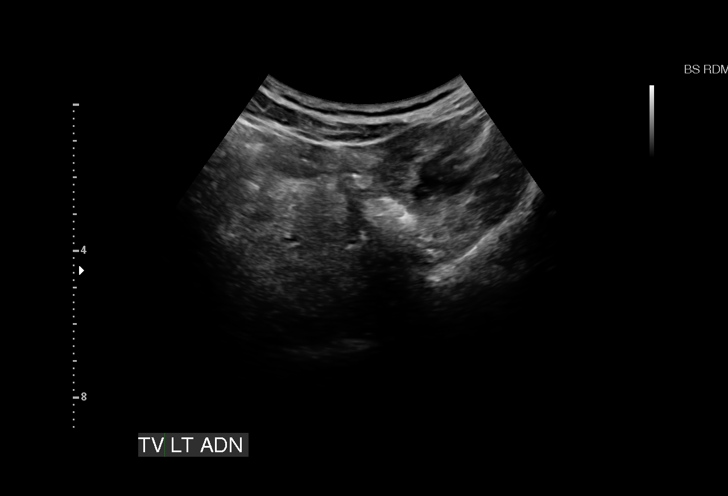

[15 of 21 positions shown; findings below may reference images not displayed]

FINDINGS: Intrauterine gestational sac: Visualized/normal in shape.

Yolk sac:  Not visualized

Embryo:  Visualized

Cardiac Activity: Visualized

Heart Rate: 182 bpm

CRL:   4.2  mm   11 w 1 d                US EDC: 12/03/2015

Subchorionic hemorrhage: A moderate to large subchorionic hemorrhage
is seen measuring approximately 8.2 x 4.8 x 6.2 cm.

Maternal uterus/adnexae: Right ovary is normal appearance. Left
ovary not directly visualized however no adnexal mass identified.
Trace amount of simple free fluid noted.
IMPRESSION: Single living IUP with assigned gestational age of 11 weeks 1 day
based on prior outside ultrasound. Appropriate fetal growth.

Large subchorionic hemorrhage noted.

## 2018-03-13 NOTE — L&D Delivery Note (Signed)
Delivery Note At 2:09 PM a viable and healthy female was delivered via Vaginal, Spontaneous (Presentation: Right occiput; anterior ).  APGAR: 8, 9; weight  pending.   Placenta status: spontaneous, intact. Cord: 3V  Anesthesia:  Epidural Episiotomy: None Lacerations:  Hymenal ring Suture Repair: 3.0 vicryl Est. Blood Loss (mL): 62  Mom to postpartum.  Baby to Couplet care / Skin to Skin.  Waynard Reeds 04/16/2018, 3:02 PM

## 2018-04-09 ENCOUNTER — Other Ambulatory Visit: Payer: Self-pay | Admitting: Obstetrics and Gynecology

## 2018-04-09 ENCOUNTER — Telehealth (HOSPITAL_COMMUNITY): Payer: Self-pay | Admitting: *Deleted

## 2018-04-09 ENCOUNTER — Encounter (HOSPITAL_COMMUNITY): Payer: Self-pay | Admitting: *Deleted

## 2018-04-09 NOTE — Telephone Encounter (Signed)
Preadmission screen  

## 2018-04-16 ENCOUNTER — Inpatient Hospital Stay (HOSPITAL_COMMUNITY): Payer: BLUE CROSS/BLUE SHIELD | Admitting: Anesthesiology

## 2018-04-16 ENCOUNTER — Encounter (HOSPITAL_COMMUNITY): Payer: Self-pay

## 2018-04-16 ENCOUNTER — Inpatient Hospital Stay (HOSPITAL_COMMUNITY)
Admission: RE | Admit: 2018-04-16 | Discharge: 2018-04-17 | DRG: 807 | Disposition: A | Discharge status: Home / Self Care | Payer: BLUE CROSS/BLUE SHIELD | Attending: Obstetrics and Gynecology | Admitting: Obstetrics and Gynecology

## 2018-04-16 DIAGNOSIS — D509 Iron deficiency anemia, unspecified: Secondary | ICD-10-CM | POA: Diagnosis present

## 2018-04-16 DIAGNOSIS — O9902 Anemia complicating childbirth: Secondary | ICD-10-CM | POA: Diagnosis present

## 2018-04-16 DIAGNOSIS — Z3483 Encounter for supervision of other normal pregnancy, third trimester: Secondary | ICD-10-CM | POA: Diagnosis present

## 2018-04-16 DIAGNOSIS — Z3A39 39 weeks gestation of pregnancy: Secondary | ICD-10-CM

## 2018-04-16 HISTORY — DX: Other specified health status: Z78.9

## 2018-04-16 LAB — CBC
HCT: 37.6 % (ref 36.0–46.0)
Hemoglobin: 12.7 g/dL (ref 12.0–15.0)
MCH: 30.7 pg (ref 26.0–34.0)
MCHC: 33.8 g/dL (ref 30.0–36.0)
MCV: 90.8 fL (ref 80.0–100.0)
Platelets: 158 10*3/uL (ref 150–400)
RBC: 4.14 MIL/uL (ref 3.87–5.11)
RDW: 13 % (ref 11.5–15.5)
WBC: 6.5 10*3/uL (ref 4.0–10.5)
nRBC: 0 % (ref 0.0–0.2)

## 2018-04-16 LAB — TYPE AND SCREEN
ABO/RH(D): O POS
Antibody Screen: NEGATIVE

## 2018-04-16 LAB — RPR: RPR Ser Ql: NONREACTIVE

## 2018-04-16 LAB — OB RESULTS CONSOLE GBS: GBS: POSITIVE

## 2018-04-16 MED ORDER — DIBUCAINE 1 % RE OINT: 1.0000 "application " | TOPICAL_OINTMENT | RECTAL | Status: DC | PRN

## 2018-04-16 MED ORDER — ACETAMINOPHEN 325 MG PO TABS: 650.0000 mg | ORAL_TABLET | ORAL | Status: DC | PRN

## 2018-04-16 MED ORDER — OXYTOCIN 40 UNITS IN NORMAL SALINE INFUSION - SIMPLE MED: 2.5000 [IU]/h | INTRAVENOUS | Status: DC

## 2018-04-16 MED ORDER — METHYLERGONOVINE MALEATE 0.2 MG/ML IJ SOLN: 0.2000 mg | INTRAMUSCULAR | Status: DC | PRN

## 2018-04-16 MED ORDER — PHENYLEPHRINE 40 MCG/ML (10ML) SYRINGE FOR IV PUSH (FOR BLOOD PRESSURE SUPPORT)
80.0000 ug | PREFILLED_SYRINGE | INTRAVENOUS | Status: DC | PRN
  Filled 2018-04-16 (×2): qty 10

## 2018-04-16 MED ORDER — EPHEDRINE 5 MG/ML INJ
10.0000 mg | INTRAVENOUS | Status: DC | PRN
  Filled 2018-04-16: qty 2

## 2018-04-16 MED ORDER — ONDANSETRON HCL 4 MG/2ML IJ SOLN: 4.0000 mg | INTRAMUSCULAR | Status: DC | PRN

## 2018-04-16 MED ORDER — LACTATED RINGERS IV SOLN: 500.0000 mL | Freq: Once | INTRAVENOUS | Status: DC

## 2018-04-16 MED ORDER — ONDANSETRON HCL 4 MG PO TABS: 4.0000 mg | ORAL_TABLET | ORAL | Status: DC | PRN

## 2018-04-16 MED ORDER — SODIUM CHLORIDE 0.9 % IV SOLN
5.0000 10*6.[IU] | Freq: Once | INTRAVENOUS | Status: AC
  Administered 2018-04-16: 5 10*6.[IU] via INTRAVENOUS
  Filled 2018-04-16: qty 5

## 2018-04-16 MED ORDER — FENTANYL 2.5 MCG/ML BUPIVACAINE 1/10 % EPIDURAL INFUSION (WH - ANES)
14.0000 mL/h | INTRAMUSCULAR | Status: DC | PRN
  Administered 2018-04-16: 14 mL/h via EPIDURAL
  Filled 2018-04-16: qty 100

## 2018-04-16 MED ORDER — LACTATED RINGERS IV SOLN: 500.0000 mL | INTRAVENOUS | Status: DC | PRN

## 2018-04-16 MED ORDER — ZOLPIDEM TARTRATE 5 MG PO TABS: 5.0000 mg | ORAL_TABLET | Freq: Every evening | ORAL | Status: DC | PRN

## 2018-04-16 MED ORDER — OXYTOCIN 40 UNITS IN NORMAL SALINE INFUSION - SIMPLE MED
1.0000 m[IU]/min | INTRAVENOUS | Status: DC
  Administered 2018-04-16: 4 m[IU]/min via INTRAVENOUS
  Filled 2018-04-16: qty 1000

## 2018-04-16 MED ORDER — OXYTOCIN BOLUS FROM INFUSION
500.0000 mL | Freq: Once | INTRAVENOUS | Status: AC
  Administered 2018-04-16: 500 mL via INTRAVENOUS

## 2018-04-16 MED ORDER — OXYCODONE-ACETAMINOPHEN 5-325 MG PO TABS: 1.0000 | ORAL_TABLET | ORAL | Status: DC | PRN

## 2018-04-16 MED ORDER — BENZOCAINE-MENTHOL 20-0.5 % EX AERO: 1.0000 "application " | INHALATION_SPRAY | CUTANEOUS | Status: DC | PRN

## 2018-04-16 MED ORDER — SIMETHICONE 80 MG PO CHEW: 80.0000 mg | CHEWABLE_TABLET | ORAL | Status: DC | PRN

## 2018-04-16 MED ORDER — SENNOSIDES-DOCUSATE SODIUM 8.6-50 MG PO TABS
2.0000 | ORAL_TABLET | ORAL | Status: DC
  Administered 2018-04-16: 2 via ORAL
  Filled 2018-04-16: qty 2

## 2018-04-16 MED ORDER — PRENATAL MULTIVITAMIN CH
1.0000 | ORAL_TABLET | Freq: Every day | ORAL | Status: DC
  Filled 2018-04-16: qty 1

## 2018-04-16 MED ORDER — SOD CITRATE-CITRIC ACID 500-334 MG/5ML PO SOLN: 30.0000 mL | ORAL | Status: DC | PRN

## 2018-04-16 MED ORDER — METHYLERGONOVINE MALEATE 0.2 MG PO TABS: 0.2000 mg | ORAL_TABLET | ORAL | Status: DC | PRN

## 2018-04-16 MED ORDER — DIPHENHYDRAMINE HCL 25 MG PO CAPS: 25.0000 mg | ORAL_CAPSULE | Freq: Four times a day (QID) | ORAL | Status: DC | PRN

## 2018-04-16 MED ORDER — TERBUTALINE SULFATE 1 MG/ML IJ SOLN
0.2500 mg | Freq: Once | INTRAMUSCULAR | Status: DC | PRN
  Filled 2018-04-16: qty 1

## 2018-04-16 MED ORDER — OXYCODONE-ACETAMINOPHEN 5-325 MG PO TABS: 2.0000 | ORAL_TABLET | ORAL | Status: DC | PRN

## 2018-04-16 MED ORDER — COCONUT OIL OIL: 1.0000 "application " | TOPICAL_OIL | Status: DC | PRN

## 2018-04-16 MED ORDER — LIDOCAINE HCL (PF) 1 % IJ SOLN
30.0000 mL | INTRAMUSCULAR | Status: DC | PRN
  Filled 2018-04-16: qty 30

## 2018-04-16 MED ORDER — ACETAMINOPHEN 325 MG PO TABS
650.0000 mg | ORAL_TABLET | ORAL | Status: DC | PRN
  Administered 2018-04-16 – 2018-04-17 (×2): 650 mg via ORAL
  Filled 2018-04-16 (×2): qty 2

## 2018-04-16 MED ORDER — LIDOCAINE HCL (PF) 1 % IJ SOLN
INTRAMUSCULAR | Status: DC | PRN
  Administered 2018-04-16 (×2): 5 mL via EPIDURAL

## 2018-04-16 MED ORDER — DIPHENHYDRAMINE HCL 50 MG/ML IJ SOLN: 12.5000 mg | INTRAMUSCULAR | Status: DC | PRN

## 2018-04-16 MED ORDER — ONDANSETRON HCL 4 MG/2ML IJ SOLN: 4.0000 mg | Freq: Four times a day (QID) | INTRAMUSCULAR | Status: DC | PRN

## 2018-04-16 MED ORDER — OXYTOCIN 40 UNITS IN NORMAL SALINE INFUSION - SIMPLE MED: 1.0000 m[IU]/min | INTRAVENOUS | Status: DC

## 2018-04-16 MED ORDER — PENICILLIN G 3 MILLION UNITS IVPB - SIMPLE MED
3.0000 10*6.[IU] | INTRAVENOUS | Status: DC
  Administered 2018-04-16: 3 10*6.[IU] via INTRAVENOUS
  Filled 2018-04-16 (×3): qty 100

## 2018-04-16 MED ORDER — LACTATED RINGERS IV SOLN
INTRAVENOUS | Status: DC
  Administered 2018-04-16 (×2): via INTRAVENOUS

## 2018-04-16 MED ORDER — PHENYLEPHRINE 40 MCG/ML (10ML) SYRINGE FOR IV PUSH (FOR BLOOD PRESSURE SUPPORT)
80.0000 ug | PREFILLED_SYRINGE | INTRAVENOUS | Status: DC | PRN
  Filled 2018-04-16: qty 10

## 2018-04-16 MED ORDER — WITCH HAZEL-GLYCERIN EX PADS: 1.0000 "application " | MEDICATED_PAD | CUTANEOUS | Status: DC | PRN

## 2018-04-16 MED ORDER — TETANUS-DIPHTH-ACELL PERTUSSIS 5-2.5-18.5 LF-MCG/0.5 IM SUSP: 0.5000 mL | Freq: Once | INTRAMUSCULAR | Status: DC

## 2018-04-16 NOTE — Anesthesia Preprocedure Evaluation (Signed)
Anesthesia Evaluation  Patient identified by MRN, date of birth, ID band Patient awake, Patient confused and Patient unresponsive    Airway Mallampati: I  TM Distance: >3 FB Neck ROM: Full    Dental no notable dental hx. (+) Teeth Intact   Pulmonary neg pulmonary ROS,    Pulmonary exam normal breath sounds clear to auscultation       Cardiovascular negative cardio ROS Normal cardiovascular exam Rhythm:Regular Rate:Normal     Neuro/Psych negative neurological ROS  negative psych ROS   GI/Hepatic negative GI ROS, Neg liver ROS,   Endo/Other  negative endocrine ROS  Renal/GU negative Renal ROS  negative genitourinary   Musculoskeletal negative musculoskeletal ROS (+)   Abdominal Normal abdominal exam  (+)   Peds  Hematology   Anesthesia Other Findings   Reproductive/Obstetrics (+) Pregnancy                             Anesthesia Physical  Anesthesia Plan  ASA: II  Anesthesia Plan: Epidural   Post-op Pain Management:    Induction:   PONV Risk Score and Plan:   Airway Management Planned:   Additional Equipment:   Intra-op Plan:   Post-operative Plan:   Informed Consent: I have reviewed the patients History and Physical, chart, labs and discussed the procedure including the risks, benefits and alternatives for the proposed anesthesia with the patient or authorized representative who has indicated his/her understanding and acceptance.       Plan Discussed with: Anesthesiologist  Anesthesia Plan Comments:         Anesthesia Quick Evaluation

## 2018-04-16 NOTE — Anesthesia Pain Management Evaluation Note (Signed)
  CRNA Pain Management Visit Note  Patient: Jodi Atkins, 28 y.o., female  "Hello I am a member of the anesthesia team at [facility]. We have an anesthesia team available at all times to provide care throughout the hospital, including epidural management and anesthesia for C-section. I don't know your plan for the delivery whether it a natural birth, water birth, IV sedation, nitrous supplementation, doula or epidural, but we want to meet your pain goals."   1.Was your pain managed to your expectations on prior hospitalizations?   Yes   2.What is your expectation for pain management during this hospitalization?     Epidural  3.How can we help you reach that goal? Epidural when ready  Record the patient's initial score and the patient's pain goal.   Pain: 2  Pain Goal: 5 [facility] wants you to be able to say your pain was always managed very well.  Edison Pace 04/16/2018

## 2018-04-16 NOTE — Anesthesia Procedure Notes (Signed)
Epidural Patient location during procedure: OB Start time: 04/16/2018 10:25 AM End time: 04/16/2018 10:28 AM  Staffing Anesthesiologist: Leilani Able, MD Performed: anesthesiologist   Preanesthetic Checklist Completed: patient identified, site marked, surgical consent, pre-op evaluation, timeout performed, IV checked, risks and benefits discussed and monitors and equipment checked  Epidural Patient position: sitting Prep: site prepped and draped and DuraPrep Patient monitoring: continuous pulse ox and blood pressure Approach: midline Location: L3-L4 Injection technique: LOR air  Needle:  Needle type: Tuohy  Needle gauge: 17 G Needle length: 9 cm and 9 Needle insertion depth: 5 cm cm Catheter type: closed end flexible Catheter size: 19 Gauge Catheter at skin depth: 10 cm Test dose: negative and Other  Assessment Sensory level: T10 Events: blood not aspirated, injection not painful, no injection resistance, negative IV test and no paresthesia  Additional Notes Reason for block:procedure for pain

## 2018-04-16 NOTE — H&P (Signed)
Adeena Apgar is a 28 y.o. female presenting for elective IOL  28 yo G3P2002 @ 39+2 presents for elective IOL. Her pregnancy has been complicated by iron deficiency anemia OB History    Gravida  5   Para  2   Term  2   Preterm      AB  2   Living  2     SAB  2   TAB      Ectopic      Multiple  0   Live Births  2          Past Medical History:  Diagnosis Date  . History of chicken pox    Past Surgical History:  Procedure Laterality Date  . NO PAST SURGERIES     Family History: family history includes Cancer in her maternal aunt, maternal grandmother, mother, and paternal grandmother. Social History:  reports that she has never smoked. She has never used smokeless tobacco. She reports that she does not drink alcohol or use drugs.     Maternal Diabetes: No Genetic Screening: Declined Maternal Ultrasounds/Referrals: Normal Fetal Ultrasounds or other Referrals:  None Maternal Substance Abuse:  No Significant Maternal Medications:  None Significant Maternal Lab Results:  None Other Comments:  None  ROS History Dilation: 3.5 Effacement (%): 60 Station: -2 Exam by:: J.Cox, RN Blood pressure 123/71, pulse 75, temperature 98.3 F (36.8 C), temperature source Oral, height 5\' 6"  (1.676 m), weight 66.5 kg, last menstrual period 07/15/2017, unknown if currently breastfeeding. Exam Physical Exam  Prenatal labs: ABO, Rh: --/--/O POS (02/04 2952) Antibody: PENDING (02/04 0755) Rubella: Immune (07/25 0000) RPR: Nonreactive (07/25 0000)  HBsAg: Negative (07/25 0000)  HIV: Non-reactive (07/25 0000)  GBS: Positive (02/04 0000)   Assessment/Plan: 1) Admit 2) PCN for GBS 3) AROM when able 4) Epidural on request   Waynard Reeds 04/16/2018, 9:44 AM

## 2018-04-17 ENCOUNTER — Other Ambulatory Visit: Payer: Self-pay

## 2018-04-17 LAB — CBC
HCT: 33.4 % — ABNORMAL LOW (ref 36.0–46.0)
Hemoglobin: 11.3 g/dL — ABNORMAL LOW (ref 12.0–15.0)
MCH: 30.3 pg (ref 26.0–34.0)
MCHC: 33.8 g/dL (ref 30.0–36.0)
MCV: 89.5 fL (ref 80.0–100.0)
NRBC: 0 % (ref 0.0–0.2)
PLATELETS: 153 10*3/uL (ref 150–400)
RBC: 3.73 MIL/uL — AB (ref 3.87–5.11)
RDW: 13 % (ref 11.5–15.5)
WBC: 9.4 10*3/uL (ref 4.0–10.5)

## 2018-04-17 NOTE — Lactation Note (Signed)
This note was copied from a baby's chart. Lactation Consultation Note  Patient Name: Jodi Atkins EHUDJ'S Date: 04/17/2018 Reason for consult: Follow-up assessment;Term  P3 mom of baby who is 13 hrs old with 4% wt loss, anticipates discharge today. Entered room to find baby sound asleep, mom states baby just had circumcision and was given Tylenol. Mom reports breastfeeding going well prior to circ, no complaints with latch. Mom describes fast let down with previous 2 children and her last child would get "choked" at the breast. Mom has a spectra pump at home that she is comfortable with using. Offered mom a manual pump and she states she already has one at home. Reviewed pump cleaning with each use, milk storage, changing positions to alleviate soreness, EBM on nipple after feeds, alternating breasts each feed, feeding with cues or at least every 3 hours.  Reviewed engorgement prevention and treatment with ice. Reviewed outpatient lactation services, breastfeeding support group, and lactation phone number on back of pamphlet. Encouraged mom to call for outpatient lactation if baby has any issues with mom's fast let down.  Maternal Data Does the patient have breastfeeding experience prior to this delivery?: Yes  Interventions Interventions: Breast feeding basics reviewed  Consult Status Consult Status: Complete Date: 04/17/18 Follow-up type: Call as needed    Virgia Land 04/17/2018, 11:15 AM

## 2018-04-17 NOTE — Discharge Summary (Signed)
Obstetric Discharge Summary Reason for Admission: Induction Prenatal Procedures: ultrasound Intrapartum Procedures: spontaneous vaginal delivery Postpartum Procedures: none Complications-Operative and Postpartum: none Hemoglobin  Date Value Ref Range Status  04/17/2018 11.3 (L) 12.0 - 15.0 g/dL Final   HCT  Date Value Ref Range Status  04/17/2018 33.4 (L) 36.0 - 46.0 % Final    Physical Exam:  General: alert and cooperative Lochia: appropriate Discharge Diagnoses: Term Pregnancy-delivered  Discharge Information: Date: 04/17/2018 Activity: pelvic rest Diet: routine Medications: PNV Condition: stable Instructions: refer to practice specific booklet Discharge to: home Follow-up Information    Waynard Reeds, MD Follow up.   Specialty:  Obstetrics and Gynecology Contact information: [address] [place] [address] [phone]           Newborn Data: Live born female  Birth Weight: 7 lb 6.9 oz (3370 g) APGAR: 8, 9  Newborn Delivery   Birth date/time:  04/16/2018 14:09:00 Delivery type:  Vaginal, Spontaneous     Home with mother.  ANDERSON,MARK E 04/17/2018, 9:07 AM

## 2018-04-17 NOTE — Progress Notes (Signed)
PPD#1 Pt without complaints. Wants to go home. VSSAF IMP/ Stable Plan/ Will discharge.

## 2018-04-17 NOTE — Lactation Note (Signed)
This note was copied from a baby's chart. Lactation Consultation Note  Patient Name: Jodi Atkins VCBSW'H Date: 04/17/2018 Reason for consult: Initial assessment;Term P3, 10 hour female infant. Per mom, infant had one void and one stool since delivery. Per mom, she finished breastfeeding infant prior to [facility] entering the room.  Mom is feeling confident with breastfeeding this is her 3rd baby, she breastfeed her first child for 3 weeks and pumped for one year with her 2nd child. Mom had been breastfeeding for 30 minutes and latch score was done by Nurse Leafy Ro). Per mom, she knows how to hand express and mom has DEBP at home. LC discussed I & O. Mom knows to breastfeed according hunger cues and not to exceed 3 hours without breastfeeding infant. Mom knows to call Nurse or LC if she has any questions, concerns or needs assistance with latching infant to breast. Mom made aware of O/P services, breastfeeding support groups, community resources, and our phone # for post-discharge questions.   Maternal Data Formula Feeding for Exclusion: No Has patient been taught Hand Expression?: Yes Does the patient have breastfeeding experience prior to this delivery?: Yes  Feeding Feeding Type: Breast Fed  LATCH Score Latch: Grasps breast easily, tongue down, lips flanged, rhythmical sucking.  Audible Swallowing: Spontaneous and intermittent  Type of Nipple: Everted at rest and after stimulation  Comfort (Breast/Nipple): Soft / non-tender  Hold (Positioning): No assistance needed to correctly position infant at breast.  LATCH Score: 10  Interventions Interventions: Breast feeding basics reviewed  Lactation Tools Discussed/Used WIC Program: No   Consult Status Consult Status: Follow-up Date: 04/17/18 Follow-up type: In-patient    Danelle Earthly 04/17/2018, 12:39 AM

## 2018-04-17 NOTE — Anesthesia Postprocedure Evaluation (Signed)
Anesthesia Post Note  Patient: Jodi Atkins  Procedure(s) Performed: AN AD HOC LABOR EPIDURAL     Patient location during evaluation: Mother Baby Anesthesia Type: Epidural Level of consciousness: awake, awake and alert and oriented Pain management: pain level controlled Vital Signs Assessment: post-procedure vital signs reviewed and stable Respiratory status: spontaneous breathing Cardiovascular status: blood pressure returned to baseline Postop Assessment: no headache, no backache, epidural receding, patient able to bend at knees, no apparent nausea or vomiting, adequate PO intake and able to ambulate Anesthetic complications: no    Last Vitals:  Vitals:   04/17/18 0030 04/17/18 0518  BP: (!) 114/55 118/72  Pulse: 70 71  Resp: 18 16  Temp: 37.3 C 36.9 C  SpO2:      Last Pain:  Vitals:   04/17/18 0518  TempSrc: Oral  PainSc: 0-No pain   Pain Goal:                   Cleda Clarks

## 2018-04-17 NOTE — Plan of Care (Signed)
Patient is doing well and should d/c this afternoon after her 24hr mark

## 2018-04-21 ENCOUNTER — Inpatient Hospital Stay (HOSPITAL_COMMUNITY): Admission: AD | Admit: 2018-04-21 | Payer: BLUE CROSS/BLUE SHIELD | Source: Home / Self Care | Admitting: Obstetrics

## 2021-03-13 NOTE — L&D Delivery Note (Signed)
Delivery Note At 2:48 PM a viable and healthy female was delivered via Vaginal, Spontaneous (Presentation: Right Occiput Anterior).  APGAR: 9, 9; weight pending .   Placenta status: Spontaneous, Intact.  Cord: 3 vessels  With one contraction, the patient pushed and delivered a vigorous female infant in the vertex presentation with apgars of 9 at 1 minute and 9 at 5 minutes. The infant was passed to the maternal abdomen. Following a 1 minute delay, the cord was clamped and cut, The placenta delivered spontaneously, intact, with 3VC. A small 1st degree perineal laceration required repair for hemostasis. EBL 154 cc. All sponge, instrument, and needle counts were correct. Mom and baby are doing well following deliver  Anesthesia: Epidural Episiotomy: None Lacerations:  1st degree perineal  Suture Repair: 3.0 vicryl Est. Blood Loss (mL): 154  Mom to postpartum.  Baby to Couplet care / Skin to Skin.  Vanessa Kick 12/21/2021, 3:09 PM

## 2021-05-25 LAB — OB RESULTS CONSOLE HIV ANTIBODY (ROUTINE TESTING): HIV: NONREACTIVE

## 2021-05-25 LAB — HEPATITIS C ANTIBODY: HCV Ab: POSITIVE

## 2021-05-25 LAB — OB RESULTS CONSOLE HEPATITIS B SURFACE ANTIGEN: Hepatitis B Surface Ag: NEGATIVE

## 2021-05-25 LAB — OB RESULTS CONSOLE RPR: RPR: NONREACTIVE

## 2021-05-25 LAB — OB RESULTS CONSOLE RUBELLA ANTIBODY, IGM: Rubella: IMMUNE

## 2021-06-02 LAB — OB RESULTS CONSOLE GC/CHLAMYDIA
Chlamydia: NEGATIVE
Neisseria Gonorrhea: NEGATIVE

## 2021-11-30 LAB — OB RESULTS CONSOLE GBS: GBS: NEGATIVE

## 2021-12-12 ENCOUNTER — Encounter (HOSPITAL_COMMUNITY): Payer: Self-pay | Admitting: *Deleted

## 2021-12-12 ENCOUNTER — Inpatient Hospital Stay (HOSPITAL_COMMUNITY)
Admission: AD | Admit: 2021-12-12 | Discharge: 2021-12-12 | Disposition: A | Discharge status: Home / Self Care | Payer: No Typology Code available for payment source | Attending: Obstetrics and Gynecology | Admitting: Obstetrics and Gynecology

## 2021-12-12 DIAGNOSIS — O471 False labor at or after 37 completed weeks of gestation: Secondary | ICD-10-CM | POA: Diagnosis present

## 2021-12-12 DIAGNOSIS — Z3A37 37 weeks gestation of pregnancy: Secondary | ICD-10-CM | POA: Diagnosis not present

## 2021-12-12 NOTE — MAU Note (Signed)
Pt says strong UC's  since 9pm PNC- VE 2 weeks ago -  1 cm  Denies HSV  GBS- neg

## 2021-12-12 NOTE — Discharge Instructions (Signed)

## 2021-12-12 NOTE — MAU Provider Note (Signed)
Event Date/Time   First Provider Initiated Contact with Patient 12/12/21 2339     S: Ms. Jodi Atkins is a 31 y.o. [id number] at [redacted]w[redacted]d  who presents to MAU today complaining contractions q 7- 10 minutes. She denies vaginal bleeding. She denies LOF. She reports normal fetal movement.    O: BP 132/71   Pulse 79   Temp 98.4 F (36.9 C) (Oral)   Resp 20   Ht 5\' 6"  (1.676 m)   Wt 68.8 kg   SpO2 100%   BMI 24.49 kg/m  GENERAL: Well-developed, well-nourished female in no acute distress.  HEAD: Normocephalic, atraumatic.  CHEST: Normal effort of breathing, regular heart rate ABDOMEN: Soft, nontender, gravid  Cervical exam:  Dilation: 2 Effacement (%): 50 Station: -3 Presentation: Vertex Exam by:: [employer], RN   Fetal Monitoring: Baseline: 120 Variability: moderate Accelerations: 15x15 Decelerations: none Contractions: 4-7  Offered recheck in 1 hour and patient declined. Requested discharge home  A: [facility] at [redacted]w[redacted]d  False labor  P: -Discharge home in stable condition -Labor precautions discussed -Patient advised to follow-up with OB as scheduled for prenatal care -Patient may return to MAU as needed or if her condition were to change or worsen   Wende Mott, [place] 12/12/2021 11:39 PM

## 2021-12-15 ENCOUNTER — Telehealth (HOSPITAL_COMMUNITY): Payer: Self-pay | Admitting: *Deleted

## 2021-12-15 ENCOUNTER — Encounter (HOSPITAL_COMMUNITY): Payer: Self-pay | Admitting: *Deleted

## 2021-12-15 NOTE — Telephone Encounter (Signed)
Preadmission screen  

## 2021-12-21 ENCOUNTER — Inpatient Hospital Stay (HOSPITAL_COMMUNITY): Payer: PRIVATE HEALTH INSURANCE | Admitting: Anesthesiology

## 2021-12-21 ENCOUNTER — Inpatient Hospital Stay (HOSPITAL_COMMUNITY): Payer: PRIVATE HEALTH INSURANCE

## 2021-12-21 ENCOUNTER — Inpatient Hospital Stay (HOSPITAL_COMMUNITY)
Admission: RE | Admit: 2021-12-21 | Discharge: 2021-12-22 | DRG: 806 | Disposition: A | Discharge status: Home / Self Care | Payer: PRIVATE HEALTH INSURANCE | Attending: Obstetrics and Gynecology | Admitting: Obstetrics and Gynecology

## 2021-12-21 ENCOUNTER — Encounter (HOSPITAL_COMMUNITY): Payer: Self-pay | Admitting: Obstetrics and Gynecology

## 2021-12-21 ENCOUNTER — Other Ambulatory Visit: Payer: Self-pay

## 2021-12-21 DIAGNOSIS — O26893 Other specified pregnancy related conditions, third trimester: Secondary | ICD-10-CM | POA: Diagnosis present

## 2021-12-21 DIAGNOSIS — Z3A39 39 weeks gestation of pregnancy: Secondary | ICD-10-CM | POA: Diagnosis not present

## 2021-12-21 DIAGNOSIS — D62 Acute posthemorrhagic anemia: Secondary | ICD-10-CM | POA: Diagnosis not present

## 2021-12-21 DIAGNOSIS — O9081 Anemia of the puerperium: Secondary | ICD-10-CM | POA: Diagnosis not present

## 2021-12-21 LAB — CBC
HCT: 32.9 % — ABNORMAL LOW (ref 36.0–46.0)
Hemoglobin: 10.6 g/dL — ABNORMAL LOW (ref 12.0–15.0)
MCH: 25.9 pg — ABNORMAL LOW (ref 26.0–34.0)
MCHC: 32.2 g/dL (ref 30.0–36.0)
MCV: 80.4 fL (ref 80.0–100.0)
Platelets: 210 10*3/uL (ref 150–400)
RBC: 4.09 MIL/uL (ref 3.87–5.11)
RDW: 15.5 % (ref 11.5–15.5)
WBC: 6.6 10*3/uL (ref 4.0–10.5)
nRBC: 0 % (ref 0.0–0.2)

## 2021-12-21 LAB — TYPE AND SCREEN
ABO/RH(D): O POS
Antibody Screen: NEGATIVE

## 2021-12-21 LAB — RPR: RPR Ser Ql: NONREACTIVE

## 2021-12-21 MED ORDER — SOD CITRATE-CITRIC ACID 500-334 MG/5ML PO SOLN: 30.0000 mL | ORAL | Status: DC | PRN

## 2021-12-21 MED ORDER — DIPHENHYDRAMINE HCL 50 MG/ML IJ SOLN: 12.5000 mg | INTRAMUSCULAR | Status: DC | PRN

## 2021-12-21 MED ORDER — PHENYLEPHRINE 80 MCG/ML (10ML) SYRINGE FOR IV PUSH (FOR BLOOD PRESSURE SUPPORT): 80.0000 ug | PREFILLED_SYRINGE | INTRAVENOUS | Status: DC | PRN

## 2021-12-21 MED ORDER — TETANUS-DIPHTH-ACELL PERTUSSIS 5-2.5-18.5 LF-MCG/0.5 IM SUSY: 0.5000 mL | PREFILLED_SYRINGE | Freq: Once | INTRAMUSCULAR | Status: DC

## 2021-12-21 MED ORDER — DIPHENHYDRAMINE HCL 25 MG PO CAPS: 25.0000 mg | ORAL_CAPSULE | Freq: Four times a day (QID) | ORAL | Status: DC | PRN

## 2021-12-21 MED ORDER — OXYTOCIN-SODIUM CHLORIDE 30-0.9 UT/500ML-% IV SOLN: 2.5000 [IU]/h | INTRAVENOUS | Status: DC | PRN

## 2021-12-21 MED ORDER — OXYTOCIN BOLUS FROM INFUSION
333.0000 mL | Freq: Once | INTRAVENOUS | Status: AC
  Administered 2021-12-21: 333 mL via INTRAVENOUS

## 2021-12-21 MED ORDER — EPHEDRINE 5 MG/ML INJ: 10.0000 mg | INTRAVENOUS | Status: DC | PRN

## 2021-12-21 MED ORDER — FENTANYL-BUPIVACAINE-NACL 0.5-0.125-0.9 MG/250ML-% EP SOLN: 12.0000 mL/h | EPIDURAL | Status: DC | PRN

## 2021-12-21 MED ORDER — FENTANYL-BUPIVACAINE-NACL 0.5-0.125-0.9 MG/250ML-% EP SOLN
12.0000 mL/h | EPIDURAL | Status: DC | PRN
  Administered 2021-12-21: 12 mL/h via EPIDURAL
  Filled 2021-12-21: qty 250

## 2021-12-21 MED ORDER — DIBUCAINE (PERIANAL) 1 % EX OINT: 1.0000 | TOPICAL_OINTMENT | CUTANEOUS | Status: DC | PRN

## 2021-12-21 MED ORDER — PRENATAL MULTIVITAMIN CH
1.0000 | ORAL_TABLET | Freq: Every day | ORAL | Status: DC
  Administered 2021-12-22: 1 via ORAL
  Filled 2021-12-21: qty 1

## 2021-12-21 MED ORDER — COCONUT OIL OIL: 1.0000 | TOPICAL_OIL | Status: DC | PRN

## 2021-12-21 MED ORDER — METHYLERGONOVINE MALEATE 0.2 MG/ML IJ SOLN: 0.2000 mg | INTRAMUSCULAR | Status: DC | PRN

## 2021-12-21 MED ORDER — OXYTOCIN-SODIUM CHLORIDE 30-0.9 UT/500ML-% IV SOLN: 2.5000 [IU]/h | INTRAVENOUS | Status: DC

## 2021-12-21 MED ORDER — TERBUTALINE SULFATE 1 MG/ML IJ SOLN: 0.2500 mg | Freq: Once | INTRAMUSCULAR | Status: DC | PRN

## 2021-12-21 MED ORDER — LIDOCAINE HCL (PF) 1 % IJ SOLN: 30.0000 mL | INTRAMUSCULAR | Status: DC | PRN

## 2021-12-21 MED ORDER — WITCH HAZEL-GLYCERIN EX PADS: 1.0000 | MEDICATED_PAD | CUTANEOUS | Status: DC | PRN

## 2021-12-21 MED ORDER — LACTATED RINGERS IV SOLN: 500.0000 mL | Freq: Once | INTRAVENOUS | Status: DC

## 2021-12-21 MED ORDER — OXYCODONE-ACETAMINOPHEN 5-325 MG PO TABS: 1.0000 | ORAL_TABLET | ORAL | Status: DC | PRN

## 2021-12-21 MED ORDER — LACTATED RINGERS IV SOLN: 500.0000 mL | INTRAVENOUS | Status: DC | PRN

## 2021-12-21 MED ORDER — FENTANYL CITRATE (PF) 100 MCG/2ML IJ SOLN: 100.0000 ug | INTRAMUSCULAR | Status: DC | PRN

## 2021-12-21 MED ORDER — ACETAMINOPHEN 325 MG PO TABS
650.0000 mg | ORAL_TABLET | ORAL | Status: DC | PRN
  Administered 2021-12-22 (×2): 650 mg via ORAL
  Filled 2021-12-21 (×3): qty 2

## 2021-12-21 MED ORDER — SIMETHICONE 80 MG PO CHEW: 80.0000 mg | CHEWABLE_TABLET | ORAL | Status: DC | PRN

## 2021-12-21 MED ORDER — OXYCODONE-ACETAMINOPHEN 5-325 MG PO TABS: 2.0000 | ORAL_TABLET | ORAL | Status: DC | PRN

## 2021-12-21 MED ORDER — LACTATED RINGERS IV SOLN: INTRAVENOUS | Status: DC

## 2021-12-21 MED ORDER — ONDANSETRON HCL 4 MG/2ML IJ SOLN: 4.0000 mg | INTRAMUSCULAR | Status: DC | PRN

## 2021-12-21 MED ORDER — OXYTOCIN-SODIUM CHLORIDE 30-0.9 UT/500ML-% IV SOLN
1.0000 m[IU]/min | INTRAVENOUS | Status: DC
  Administered 2021-12-21: 2 m[IU]/min via INTRAVENOUS
  Filled 2021-12-21: qty 500

## 2021-12-21 MED ORDER — SENNOSIDES-DOCUSATE SODIUM 8.6-50 MG PO TABS
2.0000 | ORAL_TABLET | Freq: Every day | ORAL | Status: DC
  Administered 2021-12-22: 2 via ORAL
  Filled 2021-12-21: qty 2

## 2021-12-21 MED ORDER — ONDANSETRON HCL 4 MG/2ML IJ SOLN: 4.0000 mg | Freq: Four times a day (QID) | INTRAMUSCULAR | Status: DC | PRN

## 2021-12-21 MED ORDER — ONDANSETRON HCL 4 MG PO TABS: 4.0000 mg | ORAL_TABLET | ORAL | Status: DC | PRN

## 2021-12-21 MED ORDER — LIDOCAINE HCL (PF) 1 % IJ SOLN
INTRAMUSCULAR | Status: DC | PRN
  Administered 2021-12-21: 8 mL via EPIDURAL

## 2021-12-21 MED ORDER — OXYCODONE HCL 5 MG PO TABS: 5.0000 mg | ORAL_TABLET | ORAL | Status: DC | PRN

## 2021-12-21 MED ORDER — METHYLERGONOVINE MALEATE 0.2 MG PO TABS: 0.2000 mg | ORAL_TABLET | ORAL | Status: DC | PRN

## 2021-12-21 MED ORDER — OXYCODONE HCL 5 MG PO TABS: 10.0000 mg | ORAL_TABLET | ORAL | Status: DC | PRN

## 2021-12-21 MED ORDER — ZOLPIDEM TARTRATE 5 MG PO TABS: 5.0000 mg | ORAL_TABLET | Freq: Every evening | ORAL | Status: DC | PRN

## 2021-12-21 MED ORDER — BENZOCAINE-MENTHOL 20-0.5 % EX AERO
1.0000 | INHALATION_SPRAY | CUTANEOUS | Status: DC | PRN
  Administered 2021-12-21: 1 via TOPICAL
  Filled 2021-12-21: qty 56

## 2021-12-21 MED ORDER — ACETAMINOPHEN 325 MG PO TABS: 650.0000 mg | ORAL_TABLET | ORAL | Status: DC | PRN

## 2021-12-21 NOTE — Anesthesia Procedure Notes (Signed)
Epidural Patient location during procedure: OB Start time: 12/21/2021 10:13 AM End time: 12/21/2021 10:17 AM  Staffing Anesthesiologist: Janeece Riggers, MD  Preanesthetic Checklist Completed: patient identified, IV checked, site marked, risks and benefits discussed, surgical consent, monitors and equipment checked, pre-op evaluation and timeout performed  Epidural Patient position: sitting Prep: DuraPrep and site prepped and draped Patient monitoring: continuous pulse ox and blood pressure Approach: midline Location: L3-L4 Injection technique: LOR air  Needle:  Needle type: Tuohy  Needle gauge: 17 G Needle length: 9 cm and 9 Needle insertion depth: 7 cm Catheter type: closed end flexible Catheter size: 19 Gauge Catheter at skin depth: 13 cm Test dose: negative  Assessment Events: blood not aspirated, injection not painful, no injection resistance, no paresthesia and negative IV test

## 2021-12-21 NOTE — Anesthesia Postprocedure Evaluation (Signed)
Anesthesia Post Note  Patient: Jodi Atkins  Procedure(s) Performed: AN AD HOC LABOR EPIDURAL     Patient location during evaluation: Mother Baby Anesthesia Type: Epidural Level of consciousness: awake and alert Pain management: pain level controlled Vital Signs Assessment: post-procedure vital signs reviewed and stable Respiratory status: spontaneous breathing, nonlabored ventilation and respiratory function stable Cardiovascular status: stable Postop Assessment: no headache, no backache and epidural receding Anesthetic complications: no   No notable events documented.  Last Vitals:  Vitals:   12/21/21 1517 12/21/21 1533  BP: 120/69 (!) 130/59  Pulse: 73 66  Resp: 16   Temp:    SpO2:      Last Pain:  Vitals:   12/21/21 1501  TempSrc:   PainSc: 0-No pain   Pain Goal:                   [name]

## 2021-12-21 NOTE — Anesthesia Preprocedure Evaluation (Signed)
Anesthesia Evaluation  Patient identified by MRN, date of birth, ID band Patient awake    Reviewed: Allergy & Precautions, H&P , NPO status , Patient's Chart, lab work & pertinent test results, reviewed documented beta blocker date and time   Airway Mallampati: I  TM Distance: >3 FB Neck ROM: full    Dental no notable dental hx. (+) Teeth Intact, Dental Advisory Given   Pulmonary neg pulmonary ROS,    Pulmonary exam normal breath sounds clear to auscultation       Cardiovascular negative cardio ROS Normal cardiovascular exam Rhythm:regular Rate:Normal     Neuro/Psych negative neurological ROS  negative psych ROS   GI/Hepatic negative GI ROS, Neg liver ROS,   Endo/Other  negative endocrine ROS  Renal/GU negative Renal ROS  negative genitourinary   Musculoskeletal   Abdominal   Peds  Hematology negative hematology ROS (+)   Anesthesia Other Findings   Reproductive/Obstetrics (+) Pregnancy                             Anesthesia Physical Anesthesia Plan  ASA: 2  Anesthesia Plan: Epidural   Post-op Pain Management: Minimal or no pain anticipated   Induction: Intravenous  PONV Risk Score and Plan: 2  Airway Management Planned: Natural Airway  Additional Equipment: None  Intra-op Plan:   Post-operative Plan:   Informed Consent: I have reviewed the patients History and Physical, chart, labs and discussed the procedure including the risks, benefits and alternatives for the proposed anesthesia with the patient or authorized representative who has indicated his/her understanding and acceptance.       Plan Discussed with: Anesthesiologist and CRNA  Anesthesia Plan Comments:         Anesthesia Quick Evaluation

## 2021-12-21 NOTE — H&P (Addendum)
Jodi Atkins is a 31 y.o. female presenting for EIOL  31 yo [id number] @ 39+1 presents for elective induction of labor. Her pregnancy has been uncomplicated. SHe had a False positive Hep C antibody at the beginning of her pregnancy. Her RNA was negative OB History     Gravida  6   Para  3   Term  3   Preterm      AB  2   Living  3      SAB  2   IAB      Ectopic      Multiple  0   Live Births  3          Past Medical History:  Diagnosis Date   History of chicken pox    Medical history non-contributory    Past Surgical History:  Procedure Laterality Date   NO PAST SURGERIES     Family History: family history includes Cancer in her maternal aunt, maternal grandmother, mother, and paternal grandmother. Social History:  reports that she has never smoked. She has never used smokeless tobacco. She reports that she does not drink alcohol and does not use drugs.     Maternal Diabetes: No Genetic Screening: Declined Maternal Ultrasounds/Referrals: Normal Fetal Ultrasounds or other Referrals:  None Maternal Substance Abuse:  No Significant Maternal Medications:  None Significant Maternal Lab Results:  None Number of Prenatal Visits:greater than 3 verified prenatal visits Other Comments:  None  Review of Systems History Dilation: Lip/rim Effacement (%): 100 Station: 0 Exam by:: August Albino, RN Blood pressure 133/72, pulse 75, temperature 98.2 F (36.8 C), temperature source Oral, resp. rate 16, SpO2 100 %, unknown if currently breastfeeding. Exam Physical Exam  AOx3, NAD Abd gravid FHR 130 reactive Prenatal labs: ABO, Rh: --/--/O POS (10/11 0809) Antibody: NEG (10/11 0809) Rubella: Immune (03/15 0000) RPR: NON REACTIVE (10/11 0809)  HBsAg: Negative (03/15 0000)  HIV: Non-reactive (03/15 0000)  GBS: Negative/-- (09/20 0000)   Assessment/Plan: 1) Admit 2) AROM/pit 3) Epidural on request   Vanessa Kick 12/21/2021, 3:13 PM

## 2021-12-22 LAB — CBC
HCT: 30.2 % — ABNORMAL LOW (ref 36.0–46.0)
Hemoglobin: 9.6 g/dL — ABNORMAL LOW (ref 12.0–15.0)
MCH: 25.6 pg — ABNORMAL LOW (ref 26.0–34.0)
MCHC: 31.8 g/dL (ref 30.0–36.0)
MCV: 80.5 fL (ref 80.0–100.0)
Platelets: 204 10*3/uL (ref 150–400)
RBC: 3.75 MIL/uL — ABNORMAL LOW (ref 3.87–5.11)
RDW: 15.5 % (ref 11.5–15.5)
WBC: 8.2 10*3/uL (ref 4.0–10.5)
nRBC: 0 % (ref 0.0–0.2)

## 2021-12-22 NOTE — Discharge Summary (Signed)
Postpartum Discharge Summary    Patient Name: Jodi Atkins DOB: 11-Jun-1990 MRN: [id number]  Date of admission: 12/21/2021 Delivery date:12/21/2021  Delivering provider: Vanessa Kick  Date of discharge: 12/22/2021  Admitting diagnosis: Indication for care in labor or delivery [O75.9] Spontaneous vaginal delivery [O80] Intrauterine pregnancy: [redacted]w[redacted]d     Secondary diagnosis:  Principal Problem:   Indication for care in labor or delivery Active Problems:   Spontaneous vaginal delivery  Additional problems: none    Discharge diagnosis: Term Pregnancy Delivered and Anemia- acute blood loss                                          Post partum procedures: none Augmentation: AROM and Pitocin Complications: None  Hospital course: Induction of Labor With Vaginal Delivery   31 y.o. yo [id number] at [redacted]w[redacted]d was admitted to the hospital 12/21/2021 for induction of labor.  Indication for induction: Elective.  Patient had an labor course complicated bynothing. Membrane Rupture Time/Date: 12:12 PM ,12/21/2021   Delivery Method:Vaginal, Spontaneous  Episiotomy: None  Lacerations:  1st degree  Details of delivery can be found in separate delivery note.  Patient had a postpartum course complicated bynothing. Patient is discharged home 12/22/21.  Newborn Data: Birth date:12/21/2021  Birth time:2:48 PM  Gender:Female  Living status:Living  Apgars:9 ,10  Weight:3610 g   Magnesium Sulfate received: No BMZ received: No Rhophylac:N/A Transfusion:No  Physical exam  Vitals:   12/21/21 2200 12/22/21 0145 12/22/21 0536 12/22/21 0913  BP: 118/78 101/68 100/66 107/68  Pulse: 77 70 72 68  Resp: 16 16 16 16   Temp: 98.4 F (36.9 C) 98.8 F (37.1 C) 98.1 F (36.7 C) 97.8 F (36.6 C)  TempSrc: Oral Oral Oral Oral  SpO2: 97% 97% 97% 98%  Weight:      Height:       General: alert, cooperative, and no distress Lochia: appropriate Uterine Fundus: firm Incision: N/A DVT Evaluation: No evidence of  DVT seen on physical exam. Labs: Lab Results  Component Value Date   WBC 8.2 12/22/2021   HGB 9.6 (L) 12/22/2021   HCT 30.2 (L) 12/22/2021   MCV 80.5 12/22/2021   PLT 204 12/22/2021      Latest Ref Rng & Units 08/02/2013   12:24 AM  CMP  Glucose 70 - 99 mg/dL 103   BUN 6 - 23 mg/dL 12   Creatinine 0.50 - 1.10 mg/dL 0.63   Sodium 137 - 147 mEq/L 138   Potassium 3.7 - 5.3 mEq/L 3.5   Chloride 96 - 112 mEq/L 105   CO2 19 - 32 mEq/L 22   Calcium 8.4 - 10.5 mg/dL 9.0    Edinburgh Score:    12/22/2021    9:31 AM  Edinburgh Postnatal Depression Scale Screening Tool  I have been able to laugh and see the funny side of things. 0  I have looked forward with enjoyment to things. 0  I have blamed myself unnecessarily when things went wrong. 0  I have been anxious or worried for no good reason. 0  I have felt scared or panicky for no good reason. 0  Things have been getting on top of me. 0  I have been so unhappy that I have had difficulty sleeping. 0  I have felt sad or miserable. 0  I have been so unhappy that I have been crying. 0  The  thought of harming myself has occurred to me. 0  Edinburgh Postnatal Depression Scale Total 0      After visit meds:  Allergies as of 12/22/2021       Reactions   Aleve [naproxen] Hives   Ibuprofen Hives        Medication List     TAKE these medications    multivitamin-prenatal 27-0.8 MG Tabs tablet Take 1 tablet by mouth daily with breakfast.         Discharge home in stable condition  Infant Disposition:home with mother Discharge instruction: per After Visit Summary and Postpartum booklet. Activity: Advance as tolerated. Pelvic rest for 6 weeks.  Diet: routine diet Anticipated Birth Control: Unsure Postpartum Appointment:4 weeks Additional Postpartum F/U: Postpartum Depression checkup Future Appointments:No future appointments. Follow up Visit:  Follow-up Information     Ob/Gyn, [facility]. Call in 4 week(s).    Contact information: [address] [place] [address] [phone]                     12/22/2021 Allyn Kenner, DO

## 2021-12-30 ENCOUNTER — Telehealth (HOSPITAL_COMMUNITY): Payer: Self-pay

## 2021-12-30 NOTE — Telephone Encounter (Signed)
Patient did not answer phone call. Voicemail left for patient.   Sharyn Lull [facility] 12/30/2021,1851
